# Patient Record
Sex: Female | Born: 1985 | Race: White | Hispanic: No | Marital: Married | Smoking: Never smoker
Health system: Southern US, Community
[De-identification: ages and names within clinical notes are randomized; demographics above are authoritative.]

## PROBLEM LIST (undated history)

## (undated) ENCOUNTER — Inpatient Hospital Stay (HOSPITAL_COMMUNITY): Payer: Self-pay

## (undated) DIAGNOSIS — G43909 Migraine, unspecified, not intractable, without status migrainosus: Secondary | ICD-10-CM

## (undated) DIAGNOSIS — N979 Female infertility, unspecified: Secondary | ICD-10-CM

## (undated) HISTORY — PX: OTHER SURGICAL HISTORY: SHX169

---
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
## 2008-03-21 ENCOUNTER — Other Ambulatory Visit: Admission: RE | Admit: 2008-03-21 | Discharge: 2008-03-21 | Payer: Self-pay | Admitting: Obstetrics and Gynecology

## 2009-03-15 ENCOUNTER — Emergency Department (HOSPITAL_COMMUNITY): Admission: EM | Admit: 2009-03-15 | Discharge: 2009-03-15 | Payer: Self-pay | Admitting: Emergency Medicine

## 2009-04-11 ENCOUNTER — Other Ambulatory Visit: Admission: RE | Admit: 2009-04-11 | Discharge: 2009-04-11 | Payer: Self-pay | Admitting: Obstetrics and Gynecology

## 2010-05-14 LAB — CBC
Hemoglobin: 14.1 g/dL (ref 12.0–15.0)
RBC: 4.82 MIL/uL (ref 3.87–5.11)
WBC: 8.3 10*3/uL (ref 4.0–10.5)

## 2010-05-14 LAB — DIFFERENTIAL
Lymphocytes Relative: 37 % (ref 12–46)
Lymphs Abs: 3 10*3/uL (ref 0.7–4.0)
Monocytes Absolute: 0.5 10*3/uL (ref 0.1–1.0)
Monocytes Relative: 6 % (ref 3–12)
Neutro Abs: 4.5 10*3/uL (ref 1.7–7.7)

## 2010-05-14 LAB — BASIC METABOLIC PANEL
CO2: 26 mEq/L (ref 19–32)
Calcium: 9.4 mg/dL (ref 8.4–10.5)
GFR calc Af Amer: >60 mL/min (ref >60)
GFR calc non Af Amer: >60 mL/min (ref >60)
Sodium: 136 mEq/L (ref 135–145)

## 2010-05-14 LAB — URINALYSIS, ROUTINE W REFLEX MICROSCOPIC
Glucose, UA: NEGATIVE mg/dL
Ketones, ur: NEGATIVE mg/dL
Leukocytes, UA: NEGATIVE
Specific Gravity, Urine: 1.03 (ref 1.005–1.030)
pH: 5 (ref 5.0–8.0)

## 2010-05-14 LAB — URINE MICROSCOPIC-ADD ON

## 2010-05-22 ENCOUNTER — Other Ambulatory Visit (HOSPITAL_COMMUNITY)
Admission: RE | Admit: 2010-05-22 | Discharge: 2010-05-22 | Disposition: A | Discharge status: Home / Self Care | Payer: No Typology Code available for payment source | Source: Ambulatory Visit | Attending: Obstetrics and Gynecology | Admitting: Obstetrics and Gynecology

## 2010-05-22 DIAGNOSIS — Z01419 Encounter for gynecological examination (general) (routine) without abnormal findings: Secondary | ICD-10-CM | POA: Insufficient documentation

## 2010-05-22 DIAGNOSIS — Z113 Encounter for screening for infections with a predominantly sexual mode of transmission: Secondary | ICD-10-CM | POA: Insufficient documentation

## 2011-04-01 ENCOUNTER — Other Ambulatory Visit: Payer: Self-pay | Admitting: Obstetrics and Gynecology

## 2011-04-01 DIAGNOSIS — N979 Female infertility, unspecified: Secondary | ICD-10-CM

## 2011-04-02 ENCOUNTER — Ambulatory Visit (HOSPITAL_COMMUNITY)
Admission: RE | Admit: 2011-04-02 | Discharge: 2011-04-02 | Disposition: A | Discharge status: Home / Self Care | Payer: BC Managed Care – PPO | Source: Ambulatory Visit | Attending: Obstetrics and Gynecology | Admitting: Obstetrics and Gynecology

## 2011-04-02 DIAGNOSIS — N979 Female infertility, unspecified: Secondary | ICD-10-CM | POA: Insufficient documentation

## 2011-04-02 MED ORDER — IOHEXOL 300 MG/ML  SOLN
35.0000 mL | Freq: Once | INTRAMUSCULAR | Status: AC | PRN
  Administered 2011-04-02: 35 mL

## 2011-08-13 ENCOUNTER — Encounter (HOSPITAL_BASED_OUTPATIENT_CLINIC_OR_DEPARTMENT_OTHER): Admission: RE | Payer: Self-pay | Source: Ambulatory Visit

## 2011-08-13 ENCOUNTER — Ambulatory Visit (HOSPITAL_BASED_OUTPATIENT_CLINIC_OR_DEPARTMENT_OTHER): Admission: RE | Admit: 2011-08-13 | Payer: BC Managed Care – PPO | Source: Ambulatory Visit | Admitting: Otolaryngology

## 2011-08-13 SURGERY — TONSILLECTOMY AND ADENOIDECTOMY
Anesthesia: General

## 2013-04-16 ENCOUNTER — Other Ambulatory Visit (HOSPITAL_COMMUNITY): Payer: Self-pay | Admitting: Internal Medicine

## 2013-04-16 DIAGNOSIS — H539 Unspecified visual disturbance: Secondary | ICD-10-CM

## 2013-04-16 DIAGNOSIS — R51 Headache: Principal | ICD-10-CM

## 2013-04-16 DIAGNOSIS — R519 Headache, unspecified: Secondary | ICD-10-CM

## 2013-04-19 ENCOUNTER — Ambulatory Visit (HOSPITAL_COMMUNITY)
Admission: RE | Admit: 2013-04-19 | Discharge: 2013-04-19 | Disposition: A | Discharge status: Home / Self Care | Payer: BC Managed Care – PPO | Source: Ambulatory Visit | Attending: Internal Medicine | Admitting: Internal Medicine

## 2013-04-19 DIAGNOSIS — R51 Headache: Secondary | ICD-10-CM | POA: Insufficient documentation

## 2013-04-19 DIAGNOSIS — R519 Headache, unspecified: Secondary | ICD-10-CM

## 2013-04-19 DIAGNOSIS — H539 Unspecified visual disturbance: Secondary | ICD-10-CM

## 2013-04-19 DIAGNOSIS — R5383 Other fatigue: Secondary | ICD-10-CM

## 2013-04-19 DIAGNOSIS — R5381 Other malaise: Secondary | ICD-10-CM | POA: Insufficient documentation

## 2013-04-19 DIAGNOSIS — F40298 Other specified phobia: Secondary | ICD-10-CM | POA: Insufficient documentation

## 2013-04-19 MED ORDER — GADOBENATE DIMEGLUMINE 529 MG/ML IV SOLN
15.0000 mL | Freq: Once | INTRAVENOUS | Status: AC | PRN
  Administered 2013-04-19: 15 mL via INTRAVENOUS

## 2014-01-17 LAB — OB RESULTS CONSOLE ABO/RH: RH Type: POSITIVE

## 2014-01-17 LAB — OB RESULTS CONSOLE HEPATITIS B SURFACE ANTIGEN: Hepatitis B Surface Ag: NEGATIVE

## 2014-01-17 LAB — OB RESULTS CONSOLE ANTIBODY SCREEN: Antibody Screen: NEGATIVE

## 2014-01-17 LAB — OB RESULTS CONSOLE RUBELLA ANTIBODY, IGM: RUBELLA: IMMUNE

## 2014-01-17 LAB — OB RESULTS CONSOLE HIV ANTIBODY (ROUTINE TESTING): HIV: NONREACTIVE

## 2014-01-17 LAB — OB RESULTS CONSOLE RPR: RPR: NONREACTIVE

## 2014-02-14 ENCOUNTER — Inpatient Hospital Stay (HOSPITAL_COMMUNITY)
Admission: AD | Admit: 2014-02-14 | Payer: BC Managed Care – PPO | Source: Ambulatory Visit | Admitting: Obstetrics and Gynecology

## 2014-02-25 NOTE — L&D Delivery Note (Signed)
Delivery Note At 6:22 AM a viable female "Wendy Bowers" was delivered via Vaginal, Spontaneous Delivery (Presentation: OA restituting to Left Occiput Anterior).  APGARS: 9, 9; weight pending.   Placenta status: Intact, Spontaneous.  Cord: 3 vessels with the following complications: CAN x 2, easily reduced.  Cord pH: NA  Anesthesia: Epidural  Episiotomy: None Lacerations: 2nd degree;Perineal Suture Repair: 3.0 vicryl CT-1 and SH Est. Blood Loss (mL): 606  Mom to postpartum.  Baby to Couplet care / Skin to Skin.  Pt undecided re: birth control.  Plans to breastfeed.   [name], Ginette Bradway 07/30/2014, 7:45 AM

## 2014-04-14 ENCOUNTER — Emergency Department (HOSPITAL_COMMUNITY): Payer: BLUE CROSS/BLUE SHIELD

## 2014-04-14 ENCOUNTER — Emergency Department (HOSPITAL_COMMUNITY)
Admission: EM | Admit: 2014-04-14 | Discharge: 2014-04-14 | Disposition: A | Discharge status: Home / Self Care | Payer: BLUE CROSS/BLUE SHIELD | Attending: Emergency Medicine | Admitting: Emergency Medicine

## 2014-04-14 ENCOUNTER — Encounter (HOSPITAL_COMMUNITY): Payer: Self-pay | Admitting: Emergency Medicine

## 2014-04-14 DIAGNOSIS — Z3A23 23 weeks gestation of pregnancy: Secondary | ICD-10-CM | POA: Insufficient documentation

## 2014-04-14 DIAGNOSIS — M542 Cervicalgia: Secondary | ICD-10-CM | POA: Insufficient documentation

## 2014-04-14 DIAGNOSIS — O9989 Other specified diseases and conditions complicating pregnancy, childbirth and the puerperium: Secondary | ICD-10-CM | POA: Diagnosis not present

## 2014-04-14 DIAGNOSIS — Z3490 Encounter for supervision of normal pregnancy, unspecified, unspecified trimester: Secondary | ICD-10-CM

## 2014-04-14 MED ORDER — HYDROCODONE-ACETAMINOPHEN 5-325 MG PO TABS: 1.0000 | ORAL_TABLET | Freq: Four times a day (QID) | ORAL | Status: DC | PRN

## 2014-04-14 MED ORDER — CYCLOBENZAPRINE HCL 5 MG PO TABS: 5.0000 mg | ORAL_TABLET | Freq: Three times a day (TID) | ORAL | Status: DC | PRN

## 2014-04-14 MED ORDER — FENTANYL CITRATE 0.05 MG/ML IJ SOLN
50.0000 ug | Freq: Once | INTRAMUSCULAR | Status: AC
  Administered 2014-04-14: 50 ug via INTRAMUSCULAR
  Filled 2014-04-14: qty 2

## 2014-04-14 MED ORDER — CYCLOBENZAPRINE HCL 10 MG PO TABS
10.0000 mg | ORAL_TABLET | Freq: Once | ORAL | Status: AC
  Administered 2014-04-14: 10 mg via ORAL
  Filled 2014-04-14: qty 1

## 2014-04-14 NOTE — ED Notes (Signed)
Discharge instructions given, pt demonstrated teach back and verbal understanding. No concerns voiced.  

## 2014-04-14 NOTE — ED Notes (Signed)
Pt c/o neck pain that started tonight and denies any injury.

## 2014-04-14 NOTE — ED Provider Notes (Signed)
CSN: [id number]     Arrival date & time 04/14/14  0210 History   First MD Initiated Contact with Patient 04/14/14 0246     Chief Complaint  Patient presents with  . Neck Pain     (Consider location/radiation/quality/duration/timing/severity/associated sxs/prior Treatment) HPI  Patient states she is G1 P0 AB 0, [redacted] weeks pregnant. Her due date is June 13. She reports she had her usual monthly massage on 2/16. She states at 7 PM on February 17 she started getting pain in her upper neck at the base of her skull. She states it's mainly on the right side and constant. It hurts more when any type of movement of her head. She denies any change in her activity or injury. She states she's never had this before. She states she has tingling of her toes bilaterally. She denies any weakness or difficulty walking. She denies any fever. She's had a mild cough but otherwise does not have any significant URI symptoms. She states she did put a heating pad on it and had taken a hot shower which seemed to help. About 9 PM she states she took 800 mg of acetaminophen without relief.   [facility]  History reviewed. No pertinent past medical history. History reviewed. No pertinent past surgical history. History reviewed. No pertinent family history. History  Substance Use Topics  . Smoking status: Never Smoker   . Smokeless tobacco: Not on file  . 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     Allergies  Review of patient's allergies indicates not on file.  Home Medications   Prior to Admission medications   Medication Sig Start Date End Date Taking? Authorizing Provider  Prenatal Vit-Fe Fumarate-FA (MULTIVITAMIN-PRENATAL) 27-0.8 MG TABS tablet Take 1 tablet by mouth  daily at 12 noon.   Yes Historical Provider, MD  cyclobenzaprine (FLEXERIL) 5 MG tablet Take 1 tablet (5 mg total) by mouth 3 (three) times daily as needed for muscle spasms. 04/14/14   Ward GivensIva L Akina Maish, MD  HYDROcodone-acetaminophen (NORCO) 5-325 MG per tablet Take 1 tablet by mouth every 6 (six) hours as needed for moderate pain. 04/14/14   Ward GivensIva L Demetrius Barrell, MD   BP 130/84 mmHg  Pulse 96  Temp(Src) 97.9 F (36.6 C) (Oral)  Resp 17  Ht 5\' 6"  (1.676 m)  Wt 228 lb (103.42 kg)  BMI 36.82 kg/m2  SpO2 98%  LMP 11/04/2013  Vital signs normal   Physical Exam  Constitutional: She is oriented to person, place, and time. She appears well-developed and well-nourished.  Non-toxic appearance. She does not appear ill. No distress.  HENT:  Head: Normocephalic and atraumatic.  Right Ear: External ear normal.  Left Ear: External ear normal.  Nose: Nose normal. No mucosal edema or rhinorrhea.  Mouth/Throat: Oropharynx is clear and moist and mucous membranes are normal. No dental abscesses or uvula swelling.  Eyes: Conjunctivae and EOM are normal. Pupils are equal, round, and reactive to light.  Neck: Normal range of motion and full passive range of motion without pain. Neck supple.  Cardiovascular: Normal rate, regular rhythm and normal heart sounds.  Exam reveals no gallop and no friction rub.   No  murmur heard. Pulmonary/Chest: Effort normal and breath sounds normal. No respiratory distress. She has no wheezes. She has no rhonchi. She has no rales. She exhibits no tenderness and no crepitus.  Abdominal: Soft. Normal appearance and bowel sounds are normal. She exhibits no distension. There is no tenderness. There is no rebound and no guarding.  Musculoskeletal: Normal range of motion. She exhibits no edema or tenderness.  Moves all extremities well.   Neurological: She is alert and oriented to person, place, and time. She has normal strength. No cranial nerve deficit.  No cranial nerve deficit, grips are equal  bilaterally. She has normal upper extremity strength at the level of the shoulders and elbows. She has no weakness of her lower extremities. She has subjective decreased sensation to palpation diffusely in her right lower leg.  Skin: Skin is warm, dry and intact. No rash noted. No erythema. No pallor.  Psychiatric: She has a normal mood and affect. Her speech is normal and behavior is normal. Her mood appears not anxious.  Nursing note and vitals reviewed.   ED Course  Procedures (including critical care time)  Medications  fentaNYL (SUBLIMAZE) injection 50 mcg (50 mcg Intramuscular Given 04/14/14 0325)  cyclobenzaprine (FLEXERIL) tablet 10 mg (10 mg Oral Given 04/14/14 0435)   Fetal heart rate 161  Recheck after the fentanyl. Patient states her pain is improved however she gets intermittent spasms in her neck.  04:24 Dr Su Hilt, OB, states flexeril would be a muscle relaxer she could take and ibuprofen until 28 weeks.   Labs Review Labs Reviewed - No data to display  Imaging Review Dg Cervical Spine Complete  04/14/2014   CLINICAL DATA:  Posterior neck pain yesterday.  No trauma.  EXAM: CERVICAL SPINE  4+ VIEWS  COMPARISON:  None.  FINDINGS: The cervical vertebrae are normal in height. There is a right cervical rib. No fracture is evident. No arthritic changes are evident. There is marked kyphosis centered at C5-6 and there is a mild right convex curvature centered at T1. No bone lesion or bony destruction is evident.  IMPRESSION: Curvature and kyphosis.  Right cervical rib.   Electronically Signed   By: Ellery Plunk M.D.   On: 04/14/2014 03:40     EKG Interpretation None      MDM  I feel patient's abnormal curvature of her cervical spine is because of the way she's holding her head because of pain. When she's sitting at the bedside she has a unnatural appearance to the way she holds her head. I do not feel the tingling in her extremities are sign of significant upper cervical  problem.    Final diagnoses:  Neck pain  Pregnancy    New Prescriptions   CYCLOBENZAPRINE (FLEXERIL) 5 MG TABLET    Take 1 tablet (5 mg total) by mouth 3 (three) times daily as needed for muscle spasms.   HYDROCODONE-ACETAMINOPHEN (NORCO) 5-325 MG PER TABLET    Take 1 tablet by mouth every 6 (six) hours as needed for moderate pain.  OTC ibuprofen  Plan discharge   Devoria Albe, MD, Franz Dell, MD 04/14/14 [phone]

## 2014-04-14 NOTE — Discharge Instructions (Signed)
Use ice and heat on the sore muscles in your neck. Take the medication as prescribed. You can also take ibuprofen 600 mg 4 times a day for pain (do not take ibuprofen after 28 weeks of pregnancy). Recheck if you get worse instead of better (fever, weakness in your legs, difficulty using your hands).

## 2014-05-08 ENCOUNTER — Inpatient Hospital Stay (HOSPITAL_COMMUNITY)
Admission: AD | Admit: 2014-05-08 | Payer: BLUE CROSS/BLUE SHIELD | Source: Ambulatory Visit | Admitting: Obstetrics & Gynecology

## 2014-07-29 ENCOUNTER — Encounter (HOSPITAL_COMMUNITY): Payer: Self-pay | Admitting: *Deleted

## 2014-07-29 ENCOUNTER — Inpatient Hospital Stay (HOSPITAL_COMMUNITY)
Admission: AD | Admit: 2014-07-29 | Discharge: 2014-08-01 | DRG: 774 | Disposition: A | Discharge status: Home / Self Care | Payer: BLUE CROSS/BLUE SHIELD | Source: Ambulatory Visit | Attending: Obstetrics and Gynecology | Admitting: Obstetrics and Gynecology

## 2014-07-29 ENCOUNTER — Inpatient Hospital Stay (HOSPITAL_COMMUNITY): Payer: BLUE CROSS/BLUE SHIELD

## 2014-07-29 DIAGNOSIS — D72829 Elevated white blood cell count, unspecified: Secondary | ICD-10-CM | POA: Diagnosis not present

## 2014-07-29 DIAGNOSIS — O4292 Full-term premature rupture of membranes, unspecified as to length of time between rupture and onset of labor: Principal | ICD-10-CM | POA: Diagnosis present

## 2014-07-29 DIAGNOSIS — E559 Vitamin D deficiency, unspecified: Secondary | ICD-10-CM

## 2014-07-29 DIAGNOSIS — Z3A38 38 weeks gestation of pregnancy: Secondary | ICD-10-CM | POA: Diagnosis present

## 2014-07-29 DIAGNOSIS — O99284 Endocrine, nutritional and metabolic diseases complicating childbirth: Secondary | ICD-10-CM | POA: Diagnosis present

## 2014-07-29 DIAGNOSIS — O99214 Obesity complicating childbirth: Secondary | ICD-10-CM | POA: Diagnosis present

## 2014-07-29 DIAGNOSIS — E669 Obesity, unspecified: Secondary | ICD-10-CM | POA: Diagnosis present

## 2014-07-29 DIAGNOSIS — O9913 Other diseases of the blood and blood-forming organs and certain disorders involving the immune mechanism complicating the puerperium: Secondary | ICD-10-CM | POA: Diagnosis not present

## 2014-07-29 DIAGNOSIS — O1493 Unspecified pre-eclampsia, third trimester: Secondary | ICD-10-CM | POA: Diagnosis present

## 2014-07-29 DIAGNOSIS — O99354 Diseases of the nervous system complicating childbirth: Secondary | ICD-10-CM | POA: Diagnosis present

## 2014-07-29 DIAGNOSIS — G43909 Migraine, unspecified, not intractable, without status migrainosus: Secondary | ICD-10-CM

## 2014-07-29 DIAGNOSIS — N979 Female infertility, unspecified: Secondary | ICD-10-CM

## 2014-07-29 DIAGNOSIS — Z6839 Body mass index (BMI) 39.0-39.9, adult: Secondary | ICD-10-CM

## 2014-07-29 DIAGNOSIS — O329XX0 Maternal care for malpresentation of fetus, unspecified, not applicable or unspecified: Secondary | ICD-10-CM

## 2014-07-29 DIAGNOSIS — O1490 Unspecified pre-eclampsia, unspecified trimester: Secondary | ICD-10-CM | POA: Diagnosis present

## 2014-07-29 DIAGNOSIS — Z8279 Family history of other congenital malformations, deformations and chromosomal abnormalities: Secondary | ICD-10-CM

## 2014-07-29 DIAGNOSIS — O329XX1 Maternal care for malpresentation of fetus, unspecified, fetus 1: Secondary | ICD-10-CM

## 2014-07-29 DIAGNOSIS — O09813 Supervision of pregnancy resulting from assisted reproductive technology, third trimester: Secondary | ICD-10-CM | POA: Diagnosis not present

## 2014-07-29 HISTORY — DX: Migraine, unspecified, not intractable, without status migrainosus: G43.909

## 2014-07-29 HISTORY — DX: Female infertility, unspecified: N97.9

## 2014-07-29 LAB — CBC WITH DIFFERENTIAL/PLATELET
BASOS PCT: 0 % (ref 0–1)
Basophils Absolute: 0 10*3/uL (ref 0.0–0.1)
EOS PCT: 1 % (ref 0–5)
Eosinophils Absolute: 0.2 10*3/uL (ref 0.0–0.7)
HCT: 34.3 % — ABNORMAL LOW (ref 36.0–46.0)
Hemoglobin: 11.3 g/dL — ABNORMAL LOW (ref 12.0–15.0)
LYMPHS PCT: 26 % (ref 12–46)
Lymphs Abs: 3.5 10*3/uL (ref 0.7–4.0)
MCH: 26.7 pg (ref 26.0–34.0)
MCHC: 32.9 g/dL (ref 30.0–36.0)
MCV: 80.9 fL (ref 78.0–100.0)
MONO ABS: 1 10*3/uL (ref 0.1–1.0)
MONOS PCT: 8 % (ref 3–12)
NEUTROS ABS: 8.9 10*3/uL — AB (ref 1.7–7.7)
NEUTROS PCT: 65 % (ref 43–77)
Platelets: 303 10*3/uL (ref 150–400)
RBC: 4.24 MIL/uL (ref 3.87–5.11)
RDW: 14 % (ref 11.5–15.5)
WBC: 13.7 10*3/uL — ABNORMAL HIGH (ref 4.0–10.5)

## 2014-07-29 LAB — TYPE AND SCREEN
ABO/RH(D): O POS
Antibody Screen: NEGATIVE

## 2014-07-29 LAB — COMPREHENSIVE METABOLIC PANEL
ALBUMIN: 2.6 g/dL — AB (ref 3.5–5.0)
ALT: 14 U/L (ref 14–54)
ANION GAP: 5 (ref 5–15)
AST: 17 U/L (ref 15–41)
Alkaline Phosphatase: 111 U/L (ref 38–126)
BILIRUBIN TOTAL: 0.2 mg/dL — AB (ref 0.3–1.2)
BUN: 14 mg/dL (ref 6–20)
CALCIUM: 9.2 mg/dL (ref 8.9–10.3)
CO2: 24 mmol/L (ref 22–32)
Chloride: 103 mmol/L (ref 101–111)
Creatinine, Ser: 0.61 mg/dL (ref 0.44–1.00)
GLUCOSE: 89 mg/dL (ref 65–99)
POTASSIUM: 4.7 mmol/L (ref 3.5–5.1)
Sodium: 132 mmol/L — ABNORMAL LOW (ref 135–145)
Total Protein: 6.4 g/dL — ABNORMAL LOW (ref 6.5–8.1)

## 2014-07-29 LAB — ABO/RH: ABO/RH(D): O POS

## 2014-07-29 LAB — POCT FERN TEST: POCT Fern Test: POSITIVE

## 2014-07-29 LAB — URIC ACID: Uric Acid, Serum: 4.8 mg/dL (ref 2.3–6.6)

## 2014-07-29 LAB — PROTEIN / CREATININE RATIO, URINE
CREATININE, URINE: 95 mg/dL
PROTEIN CREATININE RATIO: 0.19 mg/mg{creat} — AB (ref 0.00–0.15)
TOTAL PROTEIN, URINE: 18 mg/dL

## 2014-07-29 LAB — LACTATE DEHYDROGENASE: LDH: 136 U/L (ref 98–192)

## 2014-07-29 LAB — RPR: RPR Ser Ql: NONREACTIVE

## 2014-07-29 MED ORDER — OXYCODONE-ACETAMINOPHEN 5-325 MG PO TABS: 2.0000 | ORAL_TABLET | ORAL | Status: DC | PRN

## 2014-07-29 MED ORDER — LACTATED RINGERS IV SOLN: 500.0000 mL | INTRAVENOUS | Status: DC | PRN

## 2014-07-29 MED ORDER — NALBUPHINE HCL 10 MG/ML IJ SOLN
10.0000 mg | INTRAMUSCULAR | Status: DC | PRN
  Administered 2014-07-29: 10 mg via INTRAVENOUS
  Filled 2014-07-29: qty 1

## 2014-07-29 MED ORDER — OXYTOCIN BOLUS FROM INFUSION
500.0000 mL | INTRAVENOUS | Status: DC
  Administered 2014-07-30: 500 mL via INTRAVENOUS

## 2014-07-29 MED ORDER — OXYTOCIN 40 UNITS IN LACTATED RINGERS INFUSION - SIMPLE MED
62.5000 mL/h | INTRAVENOUS | Status: DC
  Administered 2014-07-30: 62.5 mL/h via INTRAVENOUS
  Filled 2014-07-29: qty 1000

## 2014-07-29 MED ORDER — ACETAMINOPHEN 325 MG PO TABS
650.0000 mg | ORAL_TABLET | ORAL | Status: DC | PRN
  Administered 2014-07-29 (×2): 650 mg via ORAL
  Filled 2014-07-29 (×2): qty 2

## 2014-07-29 MED ORDER — LIDOCAINE HCL (PF) 1 % IJ SOLN
30.0000 mL | INTRAMUSCULAR | Status: DC | PRN
  Filled 2014-07-29: qty 30

## 2014-07-29 MED ORDER — OXYCODONE-ACETAMINOPHEN 5-325 MG PO TABS: 1.0000 | ORAL_TABLET | ORAL | Status: DC | PRN

## 2014-07-29 MED ORDER — HYDROXYZINE HCL 50 MG PO TABS
50.0000 mg | ORAL_TABLET | Freq: Four times a day (QID) | ORAL | Status: DC | PRN
  Filled 2014-07-29: qty 1

## 2014-07-29 MED ORDER — TERBUTALINE SULFATE 1 MG/ML IJ SOLN
0.2500 mg | Freq: Once | INTRAMUSCULAR | Status: AC | PRN
Start: 2014-07-29 — End: 2014-07-29

## 2014-07-29 MED ORDER — ONDANSETRON HCL 4 MG/2ML IJ SOLN: 4.0000 mg | Freq: Four times a day (QID) | INTRAMUSCULAR | Status: DC | PRN

## 2014-07-29 MED ORDER — CITRIC ACID-SODIUM CITRATE 334-500 MG/5ML PO SOLN
30.0000 mL | ORAL | Status: DC | PRN
Start: 2014-07-29 — End: 2014-07-30

## 2014-07-29 MED ORDER — MISOPROSTOL 200 MCG PO TABS
50.0000 ug | ORAL_TABLET | ORAL | Status: DC
  Administered 2014-07-29 (×2): 50 ug via ORAL
  Filled 2014-07-29 (×2): qty 0.5

## 2014-07-29 MED ORDER — FLEET ENEMA 7-19 GM/118ML RE ENEM: 1.0000 | ENEMA | RECTAL | Status: DC | PRN

## 2014-07-29 MED ORDER — LACTATED RINGERS IV SOLN
INTRAVENOUS | Status: DC
  Administered 2014-07-30: via INTRAVENOUS

## 2014-07-29 NOTE — Progress Notes (Signed)
  Subjective: Assuming care of this 29 yo G1P0 at 38.4 wks admitted for SROM. Initial evaluation revealed preeclampsia w/o severe features. On assessment, BP improved, h/a and blurred vision resolved. Denies RUQ pain, CP, SOB, N/V, weakness or bleeding.  Doula = Viviana SimplerKenny Shulman Completed WB class --- has proof of attendance  Objective: BP 139/76 mmHg  Pulse 109  Temp(Src) 97.7 F (36.5 C) (Oral)  Resp 18  Ht 5\' 6"  (1.676 m)  Wt 112.038 kg (247 lb)  BMI 39.89 kg/m2  LMP 11/04/2013      FHT: Category 1 Lungs: CTAB CV: RRR w/o M/R/G Abdomen: gravid, soft, NTND, no guarding or rebound tenderness Pelvic: Deferred Ext: 3+ DTRs, no clonus, 3+ pitting edema UC:   irregular, every 2-3 minutes SVE:   Dilation: 1.5 Effacement (%): 70 Station: -1 Exam by:: [email or web address] Last po Cytotec at 1500  Assessment:  IUP at term. Preeclampsia w/o severe features. Cat 1 FHRT. No indication for Magnesium Sulfate or IV Labetalol at this time.   Plan: At this point, pt is not a candidate for WB as she is on po Cytotec w/ the potential for Pitocin, with preeclampsia w/o severe features. Collaborated w/ Dr. Sallye OberKulwa who agrees.  Birth plan/wishes reviewed w/ pt and spouse. Continue induction.  [name], Suzane Vanderweide CNM 07/29/2014, 4:54 PM   ADDENDUM: Per RN PE earlier showed increased edema and 1 beat clonus. Occasional increase in BPs 150s/80-90.  Sherre ScarletKimberly Adelae Yodice, CNM 07/29/14, 5:24 PM  ADDENDUM: Consulted Dr. Estanislado Pandyivard at 19:00 re: exam findings. It was again decided that pt was not a candidate for WB. Couple informed and verbalized understanding of non candidacy. She therefore requested IV pain med, then ultimately an epidural. Progressed rapidly from 3-4 cm to 6-7 cm to complete within 2 hrs. No urge to push. Allowed passive descent. Trial push initiated w/ good result. Pt and room then prepped for delivery.  07/30/14, 07:10 AM

## 2014-07-29 NOTE — MAU Note (Signed)
Pt presents to MAU with complaints of leakage of fluid since around 930 tonight. Denies any contractions. Reports good fetal movement.

## 2014-07-29 NOTE — Progress Notes (Signed)
Sherre ScarletKimberly Williams CNM notified of pt's complaints and blood pressure, orders received

## 2014-07-29 NOTE — Progress Notes (Signed)
Midwife will dicuss waterbirth protocols with pt tonight.

## 2014-07-29 NOTE — Progress Notes (Addendum)
Labor Progress  Subjective: Pt had declined augmentation or intervention pending the arrival of her husband from out of town at noon today.  Options discussed including Cytotec for cervical ripering..  Pt was open to that.  She has request to have breakfast and take a shower first.  Pt desires a waterbirth  Objective: BP 117/62 mmHg  Pulse 93  Temp(Src) 97.9 F (36.6 C) (Oral)  Resp 18  Ht 5\' 6"  (1.676 m)  Wt 247 lb (112.038 kg)  BMI 39.89 kg/m2  LMP 11/04/2013     FHT: 135 moderate variability, + accel, no decels CTX:  None, irritibility Uterus gravid, soft non tender SVE:  Dilation: Fingertip Effacement (%): 70 Exam by:: DCALLAWAY, RN   Assessment:  IUP at 38.1 weeks NICHD: Category 1 Membranes: intact Labor progress: Inadquate labor GBS: negative   Plan: Continue labor plan intermittent monitoring ok Ambulate Frequent position changes to facilitate fetal rotation and descent. Will reassess with cervical exam at 0900 or earlier if necessary Bedside US to confirm position cytotec 50mcg PO q 4 per protocol     Deandra Goering, CNM, MSN 07/29/2014. 7:49 AM  I saw patient at bedside and discussed above plan. First Cytotec was given orally at 10 AM.  EFW at 34 weeks by ultrasound: 5lbs. Dr. Sallye OberKulwa.

## 2014-07-29 NOTE — Progress Notes (Signed)
CNM clears pt to go walk at this time

## 2014-07-29 NOTE — Plan of Care (Signed)
Problem: Consults Goal: Birthing Suites Patient Information Press F2 to bring up selections list Outcome: Completed/Met Date Met:  07/29/14  Pt 37-[redacted] weeks EGA and PIH (Pregnancy induced hypertension)

## 2014-07-29 NOTE — Progress Notes (Signed)
Beside US unable to determine vertex.  limited US ordered forplacement

## 2014-07-29 NOTE — H&P (Signed)
Wendy FischerSydney L Bowers is a 29 y.o. female, G1P0 at 38.4 weeks, presenting w/ LOF, clear fluid since 10:30 PM on 07/28/14. Reports cramping. +FM. Denies bleeding, but states lost mucus plug which was pink tinged. Of note, reports new onset of generalized edema and blurred vision. Denies headache, scotomata, RUQ pain, SOB, CP, weakness or N/V.  [facility] but [place]   Patient Active Problem List   Diagnosis Date Noted  . Hypertension in pregnancy, preeclampsia 07/29/2014  . Vitamin D deficiency 07/29/2014  . Migraine headache 07/29/2014  . Infertility, female, primary - IVF pregnancy 07/29/2014  . Family history of cleft palate with cleft lip 07/29/2014    History of present pregnancy: Patient entered care at 14.2 weeks, transfer in from [facility].   EDC of 08/08/14 was established by sure LMP and that was consistent with u/s at 12.4 wks. Pregnancy achieved through IVF.   Anatomy scan: 19.1 weeks, with normal findings and a fundal placenta. Left ovary w/ hyperstimulated appearance.   Additional US evaluations:  34 wks due to IVF (growth): SIUP, vertex, normal fluid, growth 40%tile -- 5 lbs (2298 g).   Significant prenatal events: Transfer in from [facility]. [facility]). Vomiting in early pregnancy. Headaches x 2 wks at 23 wks - relieved w/ liquid Tylenol. Seen in ER at 28 wks due to neck pain after monthly massage - no significant findings - pain meds Rx'd. C/O dizziness at 28 wks - management for dizziness discussed w/ pt. Pelvic pressure between 28-30 wks after riding horse - was instructed to refrain from riding horse until pp due to fall risk. Vomiting around 36 wks in the evening and BH ctxs.TWG 24 lbs while under the care of [facility]. Pill aversion - only takes liquid Tyl when gets headache.  Last evaluation: Office 07/26/14 @ 38.1 wks by Everardo PacificSDevane-Johnson, CNM. Cvx closed/50%/-2. BP  120/78.  OB History    Gravida Para Term Preterm AB TAB SAB Ectopic Multiple Living   1              History reviewed. No pertinent past medical history. Past Surgical History  Procedure Laterality Date  . Ivf    . Wisdom tooth extraction Bilateral 1999  Bunion Surgery at age 29  Family History: family history is not on file.Sister w/ cleft lip/palate Social History:  reports that she has never smoked. She has never used smokeless tobacco. She reports that she does not drink alcohol or use illicit drugs.Pt is a Caucasian female who is married to Harbor IsleKeith. 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 She has no religious preference and will accept blood in an emergency.   Prenatal Transfer Tool  Maternal Diabetes: No Genetic Screening: Normal first trimester screen. Declined AFP. Maternal Ultrasounds/Referrals: Normal Fetal Ultrasounds or other Referrals:  None Maternal Substance Abuse:  No Significant Maternal Medications:  None Significant Maternal Lab Results: Lab values include: Group B Strep negative  TDAP: 05/17/14 Flu: 01/17/14  ROS: As noted in Hx  No Known Allergies   Dilation: Fingertip Effacement (%): 70 Exam by:: DCALLAWAY, RN Blood pressure 138/76, pulse 92, temperature 97.7 F (36.5 C), temperature source Oral, resp. rate 16, height 5\' 6"  (1.676 m), weight 112.038 kg (247 lb), last menstrual period 11/04/2013. Today's Vitals   07/29/14 0202 07/29/14 0231 07/29/14 0301 07/29/14 0331  BP: 136/76 131/85 126/83 138/76  Pulse: 93 95  92 92  Temp:    97.7 F (36.5 C)  TempSrc:    Oral  Resp:    16  Height:      Weight:      PainSc:       Chest clear Heart RRR without murmur Abd gravid, obese, NT, FH CWD Pelvic: As above. +ferning Ext: 3+ edema (pitting), no clonus  FHR: Baseline 140 w/ moderate variability, +accels, no decels UCs: Irregular, q 5 minutes  Prenatal labs: ABO, Rh: --/--/O POS (06/03  0344) Antibody: NEG (06/03 0344) Rubella:   Immune (01/17/14) RPR: Nonreactive (11/23 0000)  HBsAg: Negative (11/23 0000)  HIV: Non-reactive (11/23 0000)  GBS:  Neg (07/11/14) Sickle cell/Hgb electrophoresis: NA Pap: Normal in Nov 2015 at [facility] [place] GC: No record Chlamydia: No record Genetic screenings: Neg 1st trimester screen and neg CF screen (in past). Glucola: Normal at 113 Other: E. Coli on NOB urine culture; TOC neg. Vitamin D deficiency.  Hgb 13.1 at NOB, 12.2 at 28 weeks Results for orders placed or performed during the hospital encounter of 07/29/14 (from the past 24 hour(s))  Protein / creatinine ratio, urine     Status: Abnormal   Collection Time: 07/29/14 12:50 AM  Result Value Ref Range   Creatinine, Urine 95.00 mg/dL   Total Protein, Urine 18 mg/dL   Protein Creatinine Ratio 0.19 (H) 0.00 - 0.15 mg/mg[Cre]  Fern Test     Status: Normal   Collection Time: 07/29/14  1:09 AM  Result Value Ref Range   POCT Fern Test Positive = ruptured amniotic membanes   Uric acid     Status: None   Collection Time: 07/29/14  1:10 AM  Result Value Ref Range   Uric Acid, Serum 4.8 2.3 - 6.6 mg/dL  CBC with Differential     Status: Abnormal   Collection Time: 07/29/14  1:10 AM  Result Value Ref Range   WBC 13.7 (H) 4.0 - 10.5 K/uL   RBC 4.24 3.87 - 5.11 MIL/uL   Hemoglobin 11.3 (L) 12.0 - 15.0 g/dL   HCT 16.1 (L) 09.6 - 04.5 %   MCV 80.9 78.0 - 100.0 fL   MCH 26.7 26.0 - 34.0 pg   MCHC 32.9 30.0 - 36.0 g/dL   RDW 40.9 81.1 - 91.4 %   Platelets 303 150 - 400 K/uL   Neutrophils Relative % 65 43 - 77 %   Neutro Abs 8.9 (H) 1.7 - 7.7 K/uL   Lymphocytes Relative 26 12 - 46 %   Lymphs Abs 3.5 0.7 - 4.0 K/uL   Monocytes Relative 8 3 - 12 %   Monocytes Absolute 1.0 0.1 - 1.0 K/uL   Eosinophils Relative 1 0 - 5 %   Eosinophils Absolute 0.2 0.0 - 0.7 K/uL   Basophils Relative 0 0 - 1 %   Basophils Absolute 0.0 0.0 - 0.1 K/uL  Lactate dehydrogenase     Status: None    Collection Time: 07/29/14  1:10 AM  Result Value Ref Range   LDH 136 98 - 192 U/L  Comprehensive metabolic panel     Status: Abnormal   Collection Time: 07/29/14  1:10 AM  Result Value Ref Range   Sodium 132 (L) 135 - 145 mmol/L   Potassium 4.7 3.5 - 5.1 mmol/L   Chloride 103 101 - 111 mmol/L   CO2 24 22 - 32 mmol/L   Glucose, Bld 89 65 - 99 mg/dL   BUN 14 6 - 20 mg/dL  Creatinine, Ser 0.61 0.44 - 1.00 mg/dL   Calcium 9.2 8.9 - 16.1 mg/dL   Total Protein 6.4 (L) 6.5 - 8.1 g/dL   Albumin 2.6 (L) 3.5 - 5.0 g/dL   AST 17 15 - 41 U/L   ALT 14 14 - 54 U/L   Alkaline Phosphatase 111 38 - 126 U/L   Total Bilirubin 0.2 (L) 0.3 - 1.2 mg/dL   GFR calc non Af Amer >60 >60 mL/min   GFR calc Af Amer >60 >60 mL/min   Anion gap 5 5 - 15  Type and screen     Status: None   Collection Time: 07/29/14  3:44 AM  Result Value Ref Range   ABO/RH(D) O POS    Antibody Screen NEG    Sample Expiration 08/01/2014     Assessment/Plan: IUP at 38.4 wks Preeclampsia w/o severe features PROM GBS neg Cat 1 FHRT  Plan: Admit to Birthing Suite per consult with Dr. Estanislado Pandy Routine [facility] orders Desires [facility]. Pt aware of preeclampsia diagnosis (without severe features) and that she is not a candidate for [facility] delivery (also no record of attendance at [facility] class). She has a doula who is not present at the time of this note, but will be present during pt's labor course. Pt requested that induction be postponed until her spouse arrives from [place]. Explained that it is reasonable to wait as she is contracting although not feeling them. Pt contracting too frequently to Rx po Cytotec at this time. Will proceed w/ expectant management for now. Reviewed R&B of induction with patient, including need for serial induction, risk of C/S, need for further intervention. Patient seems to understand these risks and is agreeable with proceeding.     Robyne Askew, MS 07/29/2014, 4:35 AM

## 2014-07-29 NOTE — Progress Notes (Signed)
Addendum US cephalic Continue with cytotec for cervical rippining

## 2014-07-30 ENCOUNTER — Inpatient Hospital Stay (HOSPITAL_COMMUNITY): Payer: BLUE CROSS/BLUE SHIELD | Admitting: Anesthesiology

## 2014-07-30 ENCOUNTER — Encounter (HOSPITAL_COMMUNITY): Payer: Self-pay

## 2014-07-30 LAB — CBC
HCT: 34.5 % — ABNORMAL LOW (ref 36.0–46.0)
HEMATOCRIT: 31.8 % — AB (ref 36.0–46.0)
Hemoglobin: 10.3 g/dL — ABNORMAL LOW (ref 12.0–15.0)
Hemoglobin: 11.2 g/dL — ABNORMAL LOW (ref 12.0–15.0)
MCH: 25.8 pg — ABNORMAL LOW (ref 26.0–34.0)
MCH: 26.2 pg (ref 26.0–34.0)
MCHC: 32.4 g/dL (ref 30.0–36.0)
MCHC: 32.5 g/dL (ref 30.0–36.0)
MCV: 79.5 fL (ref 78.0–100.0)
MCV: 80.8 fL (ref 78.0–100.0)
Platelets: 279 10*3/uL (ref 150–400)
Platelets: 300 10*3/uL (ref 150–400)
RBC: 4 MIL/uL (ref 3.87–5.11)
RBC: 4.27 MIL/uL (ref 3.87–5.11)
RDW: 13.9 % (ref 11.5–15.5)
RDW: 14.3 % (ref 11.5–15.5)
WBC: 17.5 10*3/uL — ABNORMAL HIGH (ref 4.0–10.5)
WBC: 20.1 10*3/uL — ABNORMAL HIGH (ref 4.0–10.5)

## 2014-07-30 MED ORDER — SENNOSIDES-DOCUSATE SODIUM 8.6-50 MG PO TABS
2.0000 | ORAL_TABLET | ORAL | Status: DC
  Administered 2014-07-31 – 2014-08-01 (×2): 2 via ORAL
  Filled 2014-07-30 (×2): qty 2

## 2014-07-30 MED ORDER — BENZOCAINE-MENTHOL 20-0.5 % EX AERO
1.0000 "application " | INHALATION_SPRAY | CUTANEOUS | Status: DC | PRN
  Filled 2014-07-30: qty 56

## 2014-07-30 MED ORDER — PRENATAL MULTIVITAMIN CH
1.0000 | ORAL_TABLET | Freq: Every day | ORAL | Status: DC
  Administered 2014-07-30 – 2014-08-01 (×3): 1 via ORAL
  Filled 2014-07-30 (×3): qty 1

## 2014-07-30 MED ORDER — SODIUM BICARBONATE 8.4 % IV SOLN
INTRAVENOUS | Status: DC | PRN
  Administered 2014-07-30: 2 mL via EPIDURAL

## 2014-07-30 MED ORDER — ONDANSETRON HCL 4 MG PO TABS: 4.0000 mg | ORAL_TABLET | ORAL | Status: DC | PRN

## 2014-07-30 MED ORDER — ONDANSETRON HCL 4 MG/2ML IJ SOLN
4.0000 mg | INTRAMUSCULAR | Status: DC | PRN
Start: 2014-07-30 — End: 2014-08-01

## 2014-07-30 MED ORDER — OXYCODONE-ACETAMINOPHEN 5-325 MG PO TABS: 2.0000 | ORAL_TABLET | ORAL | Status: DC | PRN

## 2014-07-30 MED ORDER — EPHEDRINE 5 MG/ML INJ
10.0000 mg | INTRAVENOUS | Status: DC | PRN
  Filled 2014-07-30: qty 2

## 2014-07-30 MED ORDER — ACETAMINOPHEN 325 MG PO TABS: 650.0000 mg | ORAL_TABLET | ORAL | Status: DC | PRN

## 2014-07-30 MED ORDER — WITCH HAZEL-GLYCERIN EX PADS: 1.0000 "application " | MEDICATED_PAD | CUTANEOUS | Status: DC | PRN

## 2014-07-30 MED ORDER — DIBUCAINE 1 % RE OINT: 1.0000 "application " | TOPICAL_OINTMENT | RECTAL | Status: DC | PRN

## 2014-07-30 MED ORDER — DIPHENHYDRAMINE HCL 25 MG PO CAPS: 25.0000 mg | ORAL_CAPSULE | Freq: Four times a day (QID) | ORAL | Status: DC | PRN

## 2014-07-30 MED ORDER — PHENYLEPHRINE 40 MCG/ML (10ML) SYRINGE FOR IV PUSH (FOR BLOOD PRESSURE SUPPORT)
80.0000 ug | PREFILLED_SYRINGE | INTRAVENOUS | Status: DC | PRN
  Administered 2014-07-30: 80 ug via INTRAVENOUS
  Filled 2014-07-30: qty 2
  Filled 2014-07-30: qty 20

## 2014-07-30 MED ORDER — SIMETHICONE 80 MG PO CHEW: 80.0000 mg | CHEWABLE_TABLET | ORAL | Status: DC | PRN

## 2014-07-30 MED ORDER — LIDOCAINE HCL (PF) 1 % IJ SOLN
INTRAMUSCULAR | Status: DC | PRN
  Administered 2014-07-30: 6 mL
  Administered 2014-07-30: 4 mL

## 2014-07-30 MED ORDER — FENTANYL 2.5 MCG/ML BUPIVACAINE 1/10 % EPIDURAL INFUSION (WH - ANES)
14.0000 mL/h | INTRAMUSCULAR | Status: DC | PRN
  Administered 2014-07-30: 14 mL/h via EPIDURAL
  Filled 2014-07-30: qty 125

## 2014-07-30 MED ORDER — OXYCODONE-ACETAMINOPHEN 5-325 MG PO TABS: 1.0000 | ORAL_TABLET | ORAL | Status: DC | PRN

## 2014-07-30 MED ORDER — TETANUS-DIPHTH-ACELL PERTUSSIS 5-2.5-18.5 LF-MCG/0.5 IM SUSP: 0.5000 mL | Freq: Once | INTRAMUSCULAR | Status: DC

## 2014-07-30 MED ORDER — IBUPROFEN 600 MG PO TABS
600.0000 mg | ORAL_TABLET | Freq: Four times a day (QID) | ORAL | Status: DC
  Administered 2014-07-30 – 2014-08-01 (×8): 600 mg via ORAL
  Filled 2014-07-30 (×9): qty 1

## 2014-07-30 MED ORDER — ZOLPIDEM TARTRATE 5 MG PO TABS: 5.0000 mg | ORAL_TABLET | Freq: Every evening | ORAL | Status: DC | PRN

## 2014-07-30 MED ORDER — LANOLIN HYDROUS EX OINT: TOPICAL_OINTMENT | CUTANEOUS | Status: DC | PRN

## 2014-07-30 MED ORDER — DIPHENHYDRAMINE HCL 50 MG/ML IJ SOLN: 12.5000 mg | INTRAMUSCULAR | Status: DC | PRN

## 2014-07-30 MED ORDER — BUPIVACAINE HCL (PF) 0.25 % IJ SOLN
INTRAMUSCULAR | Status: DC | PRN
  Administered 2014-07-30: 4 mL

## 2014-07-30 NOTE — Anesthesia Postprocedure Evaluation (Signed)
  Anesthesia Post-op Note  Patient: Wendy FischerSydney L Bowers  Procedure(s) Performed: * No procedures listed *  Patient Location: Women's Unit  Anesthesia Type:Epidural  Level of Consciousness: awake  Airway and Oxygen Therapy: Patient Spontanous Breathing  Post-op Pain: mild  Post-op Assessment: Post-op Vital signs reviewed, Patient's Cardiovascular Status Stable, Respiratory Function Stable, No signs of Nausea or vomiting, Pain level controlled, No headache, No residual numbness and No residual motor weakness  Post-op Vital Signs: Reviewed  Last Vitals:  Filed Vitals:   07/30/14 1505  BP: 133/71  Pulse: 101  Temp: 36.6 C  Resp: 18    Complications: No apparent anesthesia complications

## 2014-07-30 NOTE — Progress Notes (Signed)
Pt w/ Stage 1 PPH. IV Pitocin given. No other uterotonics required. Will obtain CBC this am per protocol.  Sherre ScarletKimberly Haralambos Yeatts, CNM 07/30/14, 08:00 AM

## 2014-07-30 NOTE — Anesthesia Procedure Notes (Signed)

## 2014-07-30 NOTE — Lactation Note (Signed)
This note was copied from the chart of Wendy Jorge MandrilSydney Beightol. Lactation Consultation Note: Lactation brochure given with basic teaching done from baby and me book. This is mother's first child . She states she took breastfeeding classes. Father of infant is at the bedside for all teaching. Infant is 6 hours old and has not had her first feeding. Mother has attempt to breastfeed 2 times and states that infant is very sleepy. She states that at delivery the infant was cuing but no latch occurred. Infant was placed STS . Reviewed hand expression and observed a few drops of colostrum. Attempt to rouse infant with waking techniques. Infant not showing any feeding cues. I offered to assist mother with hand expression and spoon feed infant. Mother declines spoon feeding at this time. Advised mother to continue to rouse infant with STS and watch for feeding cues. Mother receptive to all teaching.   Patient Name: Wendy Bowers Today's Date: 07/30/2014 Reason for consult: Initial assessment   Maternal Data Has patient been taught Hand Expression?: Yes Does the patient have breastfeeding experience prior to this delivery?: No  Feeding Feeding Type: Breast Fed  LATCH Score/Interventions                      Lactation Tools Discussed/Used     Consult Status      Michel BickersKendrick, Debborah Alonge McCoy 07/30/2014, 3:22 PM

## 2014-07-30 NOTE — Anesthesia Preprocedure Evaluation (Signed)
Anesthesia Evaluation  Patient identified by MRN, date of birth, ID band Patient awake    Reviewed: Allergy & Precautions, H&P , Patient's Chart, lab work & pertinent test results  Airway Mallampati: II  TM Distance: >3 FB Neck ROM: full    Dental  (+) Teeth Intact   Pulmonary    breath sounds clear to auscultation       Cardiovascular hypertension,  Rhythm:regular Rate:Normal     Neuro/Psych    GI/Hepatic   Endo/Other  Morbid obesity  Renal/GU      Musculoskeletal   Abdominal   Peds  Hematology   Anesthesia Other Findings       Reproductive/Obstetrics (+) Pregnancy                             Anesthesia Physical Anesthesia Plan  ASA: III  Anesthesia Plan: Epidural   Post-op Pain Management:    Induction:   Airway Management Planned:   Additional Equipment:   Intra-op Plan:   Post-operative Plan:   Informed Consent: I have reviewed the patients History and Physical, chart, labs and discussed the procedure including the risks, benefits and alternatives for the proposed anesthesia with the patient or authorized representative who has indicated his/her understanding and acceptance.   Dental Advisory Given  Plan Discussed with:   Anesthesia Plan Comments: (Labs checked- platelets confirmed with RN in room. Fetal heart tracing, per RN, reported to be stable enough for sitting procedure. Discussed epidural, and patient consents to the procedure:  included risk of possible headache,backache, failed block, allergic reaction, and nerve injury. This patient was asked if she had any questions or concerns before the procedure started.)        Anesthesia Quick Evaluation  

## 2014-07-30 NOTE — Progress Notes (Addendum)
Patient has been difficult to monitor due to patient frequently changing positions. RN will adjust monitors & patient moves within a few minutes of obtaining FHR. Patient has also been off monitor to restroom several times & refused to plug monitors back in. RN explained the importance of fetal monitoring to patient, and plugged monitors back in.

## 2014-07-31 LAB — CBC
HCT: 31.1 % — ABNORMAL LOW (ref 36.0–46.0)
Hemoglobin: 10.1 g/dL — ABNORMAL LOW (ref 12.0–15.0)
MCH: 26.2 pg (ref 26.0–34.0)
MCHC: 32.5 g/dL (ref 30.0–36.0)
MCV: 80.6 fL (ref 78.0–100.0)
Platelets: 294 10*3/uL (ref 150–400)
RBC: 3.86 MIL/uL — AB (ref 3.87–5.11)
RDW: 14.2 % (ref 11.5–15.5)
WBC: 16 10*3/uL — ABNORMAL HIGH (ref 4.0–10.5)

## 2014-07-31 NOTE — Progress Notes (Addendum)
Subjective: Postpartum Day 1: Vaginal delivery, 2nd degree perineal laceration Patient up ad lib, reports no syncope or dizziness. Feeding: Breast Contraceptive plan:  Likely Micronor  Objective: Vital signs in last 24 hours: Temp:  [97.7 F (36.5 C)-98.3 F (36.8 C)] 97.7 F (36.5 C) (06/05 0542) Pulse Rate:  [88-101] 90 (06/05 0542) Resp:  [18-20] 18 (06/05 0542) BP: (127-134)/(71-86) 133/86 mmHg (06/05 0542) SpO2:  [97 %-98 %] 98 % (06/05 0542)   Filed Vitals:   07/30/14 1100 07/30/14 1505 07/30/14 2155 07/31/14 0542  BP: 127/75 133/71 130/75 133/86  Pulse: 89 101 88 90  Temp: 98.3 F (36.8 C) 97.9 F (36.6 C) 97.8 F (36.6 C) 97.7 F (36.5 C)  TempSrc: Oral Oral Oral Oral  Resp: 18 18 20 18   Height:      Weight:      SpO2: 98% 97% 98% 98%    Physical Exam:  General: alert Lochia: appropriate Uterine Fundus: firm Perineum: healing well DVT Evaluation: No evidence of DVT seen on physical exam. Negative Homan's sign.  CBC Latest Ref Rng 07/31/2014 07/30/2014 07/30/2014  WBC 4.0 - 10.5 K/uL 16.0(H) 20.1(H) 17.5(H)  Hemoglobin 12.0 - 15.0 g/dL 10.1(L) 10.3(L) 11.2(L)  Hematocrit 36.0 - 46.0 % 31.1(L) 31.8(L) 34.5(L)  Platelets 150 - 400 K/uL 294 279 300      Assessment/Plan: Status post vaginal delivery day 1. Resolving leukocytosis Stable Continue current care. Plan for discharge tomorrow    Nyra CapesLATHAM, VICKICNM 07/31/2014, 9:15 AM

## 2014-07-31 NOTE — Lactation Note (Signed)
This note was copied from the chart of Wendy Jorge MandrilSydney Pickler. Lactation Consultation Note  Patient Name: Wendy Bowers Today's Date: 07/31/2014 Reason for consult: Follow-up assessment (per mom baby just fed 10 mins, and re-latched for 10 mins )  Baby 31 hours old and has been to the breast consistently  , Average time breast feeding 10 -30 mins , and one x 7 mins. Mom denies discomfort with latching and no sore nipples thus far.  LC assessed breast tissue when LC was showing mom how to use hand pump , reviewed hand expressing and noted the areola  To have some edema at the base. Semi compressible areola , after breast massage , hand express, reverse pressure improved. Also adding the pre-pumping with the hand pump to make it more elastic at the base improved the compressibility of the areola for a deeper latch. Also to prevent mom form getting sore . LC instructed mom on the hand pump , and shells between feedings when mom isn't sleeping or 15 mins before  The feeding to elongate the nipple . Per mom doesn't feel as swollen as yesterday.   mom brought her DEBP , LC showed mom and dad how to set up and also hand pump , checked flange and  for today #24 Flange the good fit and probably will need #27 when  milk comes in due to areola edema.  According to doc flow sheets , voids and stools adequate for age. Bili check at 18 hours old 3.5.      Maternal Data Has patient been taught Hand Expression?: Yes  Feeding Feeding Type: Breast Fed Length of feed: 10 min (per mom )  LATCH Score/Interventions                Intervention(s): Breastfeeding basics reviewed (see LC note )     Lactation Tools Discussed/Used Tools: Shells;Pump;Flanges Flange Size: 27 Shell Type: Inverted Breast pump type: Manual Pump Review: Setup, frequency, and cleaning Initiated by:: MAI  Date initiated:: 07/31/14   Consult Status Consult Status: Follow-up Date: 08/01/14 Follow-up type:  In-patient    Kathrin Greathouseorio, [name] 07/31/2014, 4:08 PM

## 2014-07-31 NOTE — Discharge Instructions (Signed)

## 2014-07-31 NOTE — Discharge Summary (Addendum)
  Vaginal Delivery Discharge Summary  Wendy FischerSydney L Bowers  DOB:    December 29, 1985 MRN:    [id number] CSN:    [id number]  Date of admission:                  07/29/14  Date of discharge:                   08/01/14  Procedures this admission:   SVB, repair of 2nd degree perineal laceration  Date of Delivery: 07/30/14  Newborn Data:  Live born female  Birth Weight: 6 lb 12 oz (3062 g) APGAR: 9, 9  Home with mother. Name: Wendy GowerCharlotte   History of Present Illness:  Wendy Bowers is a 29 y.o. female, G1P1001, who presents at 163w5d weeks gestation. [facility]. She was admitted for rupture of membranes. Her pregnancy has been complicated by:  Patient Active Problem List   Diagnosis Date Noted  . Vaginal delivery 07/30/2014  . Hypertension in pregnancy, preeclampsia 07/29/2014  . Vitamin D deficiency 07/29/2014  . Migraine headache 07/29/2014  . Infertility, female, primary - IVF pregnancy 07/29/2014  . Family history of cleft palate with cleft lip 07/29/2014  . Obesity 07/29/2014     Hospital Course:   Admitting Dx: IUP at 38 5/7 weeks, SROM, pre-eclampsia without severe features GBS Status:  Negative Delivering Clinician: Sherre ScarletKimberly Williams, CNM Lacerations/MLE: 2nd perineal laceration Complications: Stage 1 PPH  Intrapartum Procedures: spontaneous vaginal delivery Postpartum Procedures: none Complications-Operative and Postpartum: 2nd degree perineal laceration and Stage 1 PPH  Discharge Diagnoses: Term Pregnancy-delivered and pre-eclampsia without severe features  Feeding:  breast  Contraception:  Micronor  Hemoglobin Results:  CBC Latest Ref Rng 07/31/2014 07/30/2014 07/30/2014  WBC 4.0 - 10.5 K/uL 16.0(H) 20.1(H) 17.5(H)  Hemoglobin 12.0 - 15.0 g/dL 10.1(L) 10.3(L) 11.2(L)  Hematocrit 36.0 - 46.0 % 31.1(L) 31.8(L) 34.5(L)  Platelets 150 - 400 K/uL 294 279 300     Discharge Physical Exam:   General: alert Lochia: appropriate Uterine Fundus: firm Incision: healing well DVT Evaluation: No evidence of DVT seen on physical exam. Negative Homan's sign.   Discharge Information:  Activity:           pelvic rest Diet:                routine Medications: Ibuprofen and Micronor Condition:      stable Instructions:  Routine pp instructions   Discharge to: home  Follow-up Information    [facility]. Schedule an appointment as soon as possible for a visit in 6 weeks.   Specialty:  Obstetrics and Gynecology   Why:  Call with any questions or concerns.   Contact information:   [address] [place] [id number] [phone]        Nigel BridgemanLATHAM, Elbie Statzer CNM 08/01/2014 11:08 AM

## 2014-08-01 MED ORDER — NORETHINDRONE 0.35 MG PO TABS
1.0000 | ORAL_TABLET | Freq: Every day | ORAL | Status: DC
Start: 2014-08-21 — End: 2016-12-22

## 2014-08-01 MED ORDER — IBUPROFEN 600 MG PO TABS: 600.0000 mg | ORAL_TABLET | Freq: Four times a day (QID) | ORAL | Status: DC | PRN

## 2014-08-01 NOTE — Lactation Note (Signed)
This note was copied from the chart of Wendy Bowers. Lactation Consultation Note  Patient Name: Wendy Jorge MandrilSydney Omura Today's Date: 08/01/2014 Reason for consult: Follow-up assessment Baby 53 hours old. Parents state baby ate within last hour. Mom states that her nipples are sore and she is uncomfortable throughout the feeding. Mom has comfort gels and is currently wearing them. Mom holding baby and LC cannot examine breasts, so enc mom to call at next BF [facility] before D/C. Discussed hand expression and engorgement prevention/treatment. Referred parents to Baby and Me booklet for number of diapers to expect by day of life and EBM storage guidelines. Mom aware of OP/BFSG and LC phone line assistance after D/C.   Maternal Data    Feeding Feeding Type: Breast Fed Length of feed: 10 min  LATCH Score/Interventions                      Lactation Tools Discussed/Used     Consult Status Consult Status: PRN    Geralynn OchsWILLIARD, [name] 08/01/2014, 11:31 AM

## 2014-08-01 NOTE — Lactation Note (Signed)
This note was copied from the chart of Wendy Bowers. Lactation Consultation Note  Patient Name: Wendy Bowers Today's Date: 08/01/2014 Reason for consult: Follow-up assessment Baby 54 hours old. Called to assist with latch. Positioned mom in football position. Mom's nipples have bright and darker red abrasions. Mom able to hand express colostrum, but baby not able to achieve/maintain a latch. Baby suckled LC's gloved finger, but not able to latch to breast. Demonstrated to parents that baby appears to have a tight anterior frenulum. Enc parents to discuss with baby's pediatrician, especially if nipple soreness continues. After a number of attempts at breast, fitted mom with #20 NS. Baby able to latch, maintain a latch, and transfer EBM. LC assessed first 20 minutes of BF. Discussed with parents that while using NS need to monitor milk supply and baby's weight. Discussed engorgement prevention/treatment. Referred parents to Baby and Me booklet for number of diapers to expect by day of life and EBM storage guidelines. Parents aware of OP/BFSG and LC phone line assistance after D/C.   Maternal Data    Feeding Feeding Type: Breast Fed Length of feed:  (LC assessed first 20 minutes of BF.)  LATCH Score/Interventions Latch: Grasps breast easily, tongue down, lips flanged, rhythmical sucking. Intervention(s): Adjust position;Assist with latch;Breast massage  Audible Swallowing: Spontaneous and intermittent  Type of Nipple: Flat  Comfort (Breast/Nipple): Filling, red/small blisters or bruises, mild/mod discomfort  Problem noted: Mild/Moderate discomfort  Hold (Positioning): Assistance needed to correctly position infant at breast and maintain latch. Intervention(s): Breastfeeding basics reviewed;Support Pillows;Position options;Skin to skin  LATCH Score: 7  Lactation Tools Discussed/Used Tools: Nipple Shields;Comfort gels;Shells Nipple shield size: 20;24 Shell Type:  Inverted Breast pump type: Manual   Consult Status Consult Status: PRN    Geralynn OchsWILLIARD, Amethyst Gainer 08/01/2014, 12:26 PM

## 2014-08-01 NOTE — Plan of Care (Signed)
Problem: Discharge Progression Outcomes Goal: Barriers To Progression Addressed/Resolved Outcome: Completed/Met Date Met:  08/01/14 Vaginal bleeding WNL. Goal: Activity appropriate for discharge plan Outcome: Completed/Met Date Met:  08/01/14 Tolerates walks in halls and room well. Goal: Complications resolved/controlled Outcome: Completed/Met Date Met:  08/01/14 Vaginal bleeding WNL DTR's WNL Goal: Pain controlled with appropriate interventions Outcome: Completed/Met Date Met:  08/01/14 Good pain control on po Motrin.

## 2016-02-26 NOTE — L&D Delivery Note (Signed)
Delivery Note At 5:48 PM a viable and healthy female was delivered via Vaginal, Spontaneous Delivery (Presentation: LOA  ).  APGAR: 8, 9; weight  .   Placenta status: spontaneous, intact.  Cord:  with the following complications: none.  Cord pH: na  Anesthesia:  epidural Episiotomy:  na Lacerations: 2nd degree;Perineal Suture Repair: 2.0 vicryl rapide Est. Blood Loss (mL): 100  Mom to postpartum.  Baby to Couplet care / Skin to Skin.  Tyreck Bell J 12/25/2016, 6:02 PM

## 2016-05-28 DIAGNOSIS — Z3201 Encounter for pregnancy test, result positive: Secondary | ICD-10-CM | POA: Diagnosis not present

## 2016-05-28 DIAGNOSIS — Z6827 Body mass index (BMI) 27.0-27.9, adult: Secondary | ICD-10-CM | POA: Diagnosis not present

## 2016-06-04 DIAGNOSIS — Z3481 Encounter for supervision of other normal pregnancy, first trimester: Secondary | ICD-10-CM | POA: Diagnosis not present

## 2016-06-04 DIAGNOSIS — Z3689 Encounter for other specified antenatal screening: Secondary | ICD-10-CM | POA: Diagnosis not present

## 2016-06-10 DIAGNOSIS — Z3481 Encounter for supervision of other normal pregnancy, first trimester: Secondary | ICD-10-CM | POA: Diagnosis not present

## 2016-06-10 DIAGNOSIS — Z3491 Encounter for supervision of normal pregnancy, unspecified, first trimester: Secondary | ICD-10-CM | POA: Diagnosis not present

## 2016-06-10 DIAGNOSIS — Z3689 Encounter for other specified antenatal screening: Secondary | ICD-10-CM | POA: Diagnosis not present

## 2016-06-10 DIAGNOSIS — Z36 Encounter for antenatal screening for chromosomal anomalies: Secondary | ICD-10-CM | POA: Diagnosis not present

## 2016-06-26 DIAGNOSIS — Z3682 Encounter for antenatal screening for nuchal translucency: Secondary | ICD-10-CM | POA: Diagnosis not present

## 2016-06-26 DIAGNOSIS — Z3481 Encounter for supervision of other normal pregnancy, first trimester: Secondary | ICD-10-CM | POA: Diagnosis not present

## 2016-07-16 DIAGNOSIS — J060 Acute laryngopharyngitis: Secondary | ICD-10-CM | POA: Diagnosis not present

## 2016-07-16 DIAGNOSIS — Z6837 Body mass index (BMI) 37.0-37.9, adult: Secondary | ICD-10-CM | POA: Diagnosis not present

## 2016-07-16 DIAGNOSIS — Z331 Pregnant state, incidental: Secondary | ICD-10-CM | POA: Diagnosis not present

## 2016-07-18 DIAGNOSIS — Z3482 Encounter for supervision of other normal pregnancy, second trimester: Secondary | ICD-10-CM | POA: Diagnosis not present

## 2016-07-18 DIAGNOSIS — Z361 Encounter for antenatal screening for raised alphafetoprotein level: Secondary | ICD-10-CM | POA: Diagnosis not present

## 2016-10-14 LAB — OB RESULTS CONSOLE GBS: STREP GROUP B AG: NEGATIVE

## 2016-10-14 LAB — OB RESULTS CONSOLE RUBELLA ANTIBODY, IGM: RUBELLA: IMMUNE

## 2016-10-14 LAB — OB RESULTS CONSOLE RPR: RPR: NONREACTIVE

## 2016-10-14 LAB — OB RESULTS CONSOLE GC/CHLAMYDIA
Chlamydia: NEGATIVE
Gonorrhea: NEGATIVE

## 2016-10-14 LAB — OB RESULTS CONSOLE HEPATITIS B SURFACE ANTIGEN: HEP B S AG: NEGATIVE

## 2016-10-14 LAB — OB RESULTS CONSOLE HIV ANTIBODY (ROUTINE TESTING): HIV: NONREACTIVE

## 2016-12-22 ENCOUNTER — Inpatient Hospital Stay (HOSPITAL_COMMUNITY)
Admission: AD | Admit: 2016-12-22 | Discharge: 2016-12-22 | Disposition: A | Discharge status: Home / Self Care | Payer: Medicaid Other | Source: Ambulatory Visit | Attending: Obstetrics and Gynecology | Admitting: Obstetrics and Gynecology

## 2016-12-22 ENCOUNTER — Encounter (HOSPITAL_COMMUNITY): Payer: Self-pay | Admitting: *Deleted

## 2016-12-22 DIAGNOSIS — R030 Elevated blood-pressure reading, without diagnosis of hypertension: Secondary | ICD-10-CM | POA: Insufficient documentation

## 2016-12-22 DIAGNOSIS — O26853 Spotting complicating pregnancy, third trimester: Secondary | ICD-10-CM

## 2016-12-22 DIAGNOSIS — O163 Unspecified maternal hypertension, third trimester: Secondary | ICD-10-CM

## 2016-12-22 DIAGNOSIS — Z79899 Other long term (current) drug therapy: Secondary | ICD-10-CM | POA: Insufficient documentation

## 2016-12-22 DIAGNOSIS — O26893 Other specified pregnancy related conditions, third trimester: Secondary | ICD-10-CM | POA: Insufficient documentation

## 2016-12-22 DIAGNOSIS — Z3A38 38 weeks gestation of pregnancy: Secondary | ICD-10-CM | POA: Insufficient documentation

## 2016-12-22 LAB — COMPREHENSIVE METABOLIC PANEL
ALT: 16 U/L (ref 14–54)
ANION GAP: 13 (ref 5–15)
AST: 19 U/L (ref 15–41)
Albumin: 2.7 g/dL — ABNORMAL LOW (ref 3.5–5.0)
Alkaline Phosphatase: 160 U/L — ABNORMAL HIGH (ref 38–126)
BILIRUBIN TOTAL: 0.3 mg/dL (ref 0.3–1.2)
BUN: 12 mg/dL (ref 6–20)
CALCIUM: 9.5 mg/dL (ref 8.9–10.3)
CO2: 25 mmol/L (ref 22–32)
Chloride: 97 mmol/L — ABNORMAL LOW (ref 101–111)
Creatinine, Ser: 0.64 mg/dL (ref 0.44–1.00)
GFR calc Af Amer: >60 mL/min (ref >60)
Glucose, Bld: 105 mg/dL — ABNORMAL HIGH (ref 65–99)
POTASSIUM: 5 mmol/L (ref 3.5–5.1)
Sodium: 135 mmol/L (ref 135–145)
Total Protein: 6.6 g/dL (ref 6.5–8.1)

## 2016-12-22 LAB — CBC
HEMATOCRIT: 35.3 % — AB (ref 36.0–46.0)
Hemoglobin: 11.4 g/dL — ABNORMAL LOW (ref 12.0–15.0)
MCH: 26 pg (ref 26.0–34.0)
MCHC: 32.3 g/dL (ref 30.0–36.0)
MCV: 80.4 fL (ref 78.0–100.0)
Platelets: 343 10*3/uL (ref 150–400)
RBC: 4.39 MIL/uL (ref 3.87–5.11)
RDW: 14.5 % (ref 11.5–15.5)
WBC: 12.3 10*3/uL — AB (ref 4.0–10.5)

## 2016-12-22 LAB — URINALYSIS, ROUTINE W REFLEX MICROSCOPIC
Bilirubin Urine: NEGATIVE
GLUCOSE, UA: NEGATIVE mg/dL
Ketones, ur: NEGATIVE mg/dL
Leukocytes, UA: NEGATIVE
Nitrite: NEGATIVE
PROTEIN: NEGATIVE mg/dL
Specific Gravity, Urine: 1.019 (ref 1.005–1.030)
pH: 5 (ref 5.0–8.0)

## 2016-12-22 LAB — PROTEIN / CREATININE RATIO, URINE
CREATININE, URINE: 110 mg/dL
Protein Creatinine Ratio: 0.11 mg/mg{Cre} (ref 0.00–0.15)
Total Protein, Urine: 12 mg/dL

## 2016-12-22 LAB — POCT FERN TEST: POCT FERN TEST: NEGATIVE

## 2016-12-22 NOTE — MAU Provider Note (Signed)
History     CSN: [id number]  Arrival date and time: 12/22/16 2059   First Provider Initiated Contact with Patient 12/22/16 2140      No chief complaint on file.  HPI  Ms. Wendy Bowers is a 31 y.o. G2P1001 at [redacted]w[redacted]d gestation presenting to MAU with complaints of pink spotting with wiping.  She just got off the road from travelling to [place] when she noticed the pink spotting. She called Dr. Billy Coast and he told her to come in for evaluation. She has received PNC with [facility], but recently been seen by a MD in [place]. She was last seen 10/23 where her cervix was dilated 1.5 cm. She does report having some UC's today. She also complains of leaking a clear fluid off and on all day today. She had a H/A yesterday that she took 1 Tylenol for.  The Tylenol did not relieve her H/A, but she was able to sleep and the H/A went away. She reports good (+) FM.  Past Medical History:  Diagnosis Date  . Infertility, female   . Migraine     Past Surgical History:  Procedure Laterality Date  . IVF    . WISDOM TOOTH EXTRACTION Bilateral 1999    History reviewed. No pertinent family history.  Social History  Substance Use Topics  . Smoking status: Never Smoker  . Smokeless tobacco: Never Used  . Alcohol use No    Allergies: No Known Allergies  Prescriptions Prior to Admission  Medication Sig Dispense Refill Last Dose  . acetaminophen (TYLENOL) 160 MG/5ML liquid Take 320 mg by mouth every 6 (six) hours as needed for pain (or headache).   12/21/2016 at Unknown time  . ranitidine (ZANTAC) 150 MG capsule Take 150 mg by mouth 2 (two) times daily.   12/21/2016 at Unknown time  . calcium carbonate (TUMS - DOSED IN MG ELEMENTAL CALCIUM) 500 MG chewable tablet Chew 1 tablet by mouth 2 (two) times daily as needed for indigestion or heartburn.   More than a month at Unknown time  . ibuprofen (ADVIL,MOTRIN) 600 MG tablet Take 1 tablet (600 mg total) by mouth every 6 (six) hours as needed. 30 tablet 1 More than a  month at Unknown time  . norethindrone (ORTHO MICRONOR) 0.35 MG tablet Take 1 tablet (0.35 mg total) by mouth daily. 1 Package 11 More than a month at Unknown time    Review of Systems  Constitutional: Negative.   HENT: Negative.   Eyes: Negative.   Respiratory: Negative.   Cardiovascular: Negative.   Gastrointestinal: Negative.   Endocrine: Negative.   Genitourinary: Positive for vaginal bleeding.  Musculoskeletal: Negative.   Skin: Negative.   Allergic/Immunologic: Negative.   Neurological: Negative.   Hematological: Negative.   Psychiatric/Behavioral: Negative.    Physical Exam   Blood pressure (!) 142/86, pulse 91, temperature 98 F (36.7 C), resp. rate 16, height 5\' 6"  (1.676 m), weight 245 lb (111.1 kg). Patient Vitals for the past 24 hrs:  BP Temp Pulse Resp Height Weight  12/22/16 2259 131/83 - 91 - - -  12/22/16 2223 132/88 - - - - -  12/22/16 2200 137/85 - - - - -  12/22/16 2136 (!) 142/86 - 91 - - -  12/22/16 2131 (!) 145/93 - 93 16 - -  12/22/16 2124 (!) 145/87 98 F (36.7 C) 94 16 5\' 6"  (1.676 m) 245 lb (111.1 kg)    Physical Exam  Nursing note and vitals reviewed. Constitutional: She appears well-developed and  well-nourished.  HENT:  Head: Normocephalic.  Eyes: Pupils are equal, round, and reactive to light.  Neck: Normal range of motion.  Cardiovascular: Normal rate, regular rhythm and normal heart sounds.   Respiratory: Effort normal and breath sounds normal.  GI: Soft. Bowel sounds are normal.  Genitourinary:  Genitourinary Comments: Uterus: gravid, S=D, cx: smooth, pink, no lesions, scant amt of pink tinged vaginal d/c -- fern slide obtained   Dilation: 1.5 Effacement (%): 50 Station: -3 Presentation: Vertex Exam by:: Carloyn Jaeger CNM     MAU Course  Procedures  MDM CCUA CBC CMP P/C Ratio Serial BP's Fern Test NST - FHR: 145 bpm / moderate variability / accels present / decels absent / TOCO: regular every 4-5 mins   Results for orders  placed or performed during the hospital encounter of 12/22/16 (from the past 24 hour(s))  CBC     Status: Abnormal   Collection Time: 12/22/16  9:52 PM  Result Value Ref Range   WBC 12.3 (H) 4.0 - 10.5 K/uL   RBC 4.39 3.87 - 5.11 MIL/uL   Hemoglobin 11.4 (L) 12.0 - 15.0 g/dL   HCT 16.1 (L) 09.6 - 04.5 %   MCV 80.4 78.0 - 100.0 fL   MCH 26.0 26.0 - 34.0 pg   MCHC 32.3 30.0 - 36.0 g/dL   RDW 40.9 81.1 - 91.4 %   Platelets 343 150 - 400 K/uL  Comprehensive metabolic panel     Status: Abnormal   Collection Time: 12/22/16  9:52 PM  Result Value Ref Range   Sodium 135 135 - 145 mmol/L   Potassium 5.0 3.5 - 5.1 mmol/L   Chloride 97 (L) 101 - 111 mmol/L   CO2 25 22 - 32 mmol/L   Glucose, Bld 105 (H) 65 - 99 mg/dL   BUN 12 6 - 20 mg/dL   Creatinine, Ser 7.82 0.44 - 1.00 mg/dL   Calcium 9.5 8.9 - 95.6 mg/dL   Total Protein 6.6 6.5 - 8.1 g/dL   Albumin 2.7 (L) 3.5 - 5.0 g/dL   AST 19 15 - 41 U/L   ALT 16 14 - 54 U/L   Alkaline Phosphatase 160 (H) 38 - 126 U/L   Total Bilirubin 0.3 0.3 - 1.2 mg/dL   GFR calc non Af Amer >60 >60 mL/min   GFR calc Af Amer >60 >60 mL/min   Anion gap 13 5 - 15  Protein / creatinine ratio, urine     Status: None   Collection Time: 12/22/16 10:00 PM  Result Value Ref Range   Creatinine, Urine 110.00 mg/dL   Total Protein, Urine 12 mg/dL   Protein Creatinine Ratio 0.11 0.00 - 0.15 mg/mg[Cre]  Urinalysis, Routine w reflex microscopic     Status: Abnormal   Collection Time: 12/22/16 10:00 PM  Result Value Ref Range   Color, Urine YELLOW YELLOW   APPearance CLEAR CLEAR   Specific Gravity, Urine 1.019 1.005 - 1.030   pH 5.0 5.0 - 8.0   Glucose, UA NEGATIVE NEGATIVE mg/dL   Hgb urine dipstick MODERATE (A) NEGATIVE   Bilirubin Urine NEGATIVE NEGATIVE   Ketones, ur NEGATIVE NEGATIVE mg/dL   Protein, ur NEGATIVE NEGATIVE mg/dL   Nitrite NEGATIVE NEGATIVE   Leukocytes, UA NEGATIVE NEGATIVE   RBC / HPF 0-5 0 - 5 RBC/hpf   WBC, UA 0-5 0 - 5 WBC/hpf    Bacteria, UA RARE (A) NONE SEEN   Squamous Epithelial / LPF 0-5 (A) NONE SEEN   Mucus PRESENT  Fern Test     Status: None   Collection Time: 12/22/16 10:19 PM  Result Value Ref Range   POCT Fern Test Negative = intact amniotic membranes     *Consult with Dr. Billy Coastaavon @ 2310 - notified of patient's complaints, assessments, lab results, tx plan d/c home with PEC precautions, keep appt @ [facility] on 10/30 - ok to d/c home, agrees with plan  Assessment and Plan  Spotting during pregnancy in third trimester - Information provided on vaginal bleeding during pregnancy, third trimester   Elevated blood pressure affecting pregnancy in third trimester, antepartum - Information provided on HTN during pregnancy and PEC - Keep scheduled appt with Dr. Billy Coastaavon on 10/30    Discharge home Patient verbalized an understanding of the plan of care and agrees.   Raelyn Moraolitta Yoland Scherr, MSN, CNM 12/22/2016, 9:54 PM

## 2016-12-22 NOTE — MAU Note (Signed)
Pt drove from [place] today and c/o sore low back during the trip although she spent it lying down; and reports light pink bleeding after wiping X3 today as well as clear, watery, leaking since 1200 today.

## 2016-12-25 ENCOUNTER — Encounter (HOSPITAL_COMMUNITY): Payer: Self-pay

## 2016-12-25 ENCOUNTER — Inpatient Hospital Stay (HOSPITAL_COMMUNITY)
Admission: AD | Admit: 2016-12-25 | Discharge: 2016-12-27 | DRG: 807 | Disposition: A | Discharge status: Home / Self Care | Payer: Medicaid Other | Source: Ambulatory Visit | Attending: Obstetrics and Gynecology | Admitting: Obstetrics and Gynecology

## 2016-12-25 ENCOUNTER — Inpatient Hospital Stay (HOSPITAL_COMMUNITY): Payer: Medicaid Other | Admitting: Anesthesiology

## 2016-12-25 DIAGNOSIS — O139 Gestational [pregnancy-induced] hypertension without significant proteinuria, unspecified trimester: Secondary | ICD-10-CM | POA: Diagnosis present

## 2016-12-25 DIAGNOSIS — O134 Gestational [pregnancy-induced] hypertension without significant proteinuria, complicating childbirth: Principal | ICD-10-CM | POA: Diagnosis present

## 2016-12-25 DIAGNOSIS — D649 Anemia, unspecified: Secondary | ICD-10-CM | POA: Diagnosis present

## 2016-12-25 DIAGNOSIS — O26853 Spotting complicating pregnancy, third trimester: Secondary | ICD-10-CM

## 2016-12-25 DIAGNOSIS — O9962 Diseases of the digestive system complicating childbirth: Secondary | ICD-10-CM | POA: Diagnosis present

## 2016-12-25 DIAGNOSIS — Z3A38 38 weeks gestation of pregnancy: Secondary | ICD-10-CM | POA: Diagnosis not present

## 2016-12-25 DIAGNOSIS — O99214 Obesity complicating childbirth: Secondary | ICD-10-CM | POA: Diagnosis present

## 2016-12-25 DIAGNOSIS — O9902 Anemia complicating childbirth: Secondary | ICD-10-CM | POA: Diagnosis present

## 2016-12-25 DIAGNOSIS — K219 Gastro-esophageal reflux disease without esophagitis: Secondary | ICD-10-CM | POA: Diagnosis present

## 2016-12-25 DIAGNOSIS — O163 Unspecified maternal hypertension, third trimester: Secondary | ICD-10-CM

## 2016-12-25 LAB — CBC
HEMATOCRIT: 35.9 % — AB (ref 36.0–46.0)
HEMATOCRIT: 34.2 % — AB (ref 36.0–46.0)
Hemoglobin: 11.6 g/dL — ABNORMAL LOW (ref 12.0–15.0)
Hemoglobin: 11.1 g/dL — ABNORMAL LOW (ref 12.0–15.0)
MCH: 25.9 pg — AB (ref 26.0–34.0)
MCH: 26.1 pg (ref 26.0–34.0)
MCHC: 32.3 g/dL (ref 30.0–36.0)
MCHC: 32.5 g/dL (ref 30.0–36.0)
MCV: 80.1 fL (ref 78.0–100.0)
MCV: 80.3 fL (ref 78.0–100.0)
PLATELETS: 333 10*3/uL (ref 150–400)
Platelets: 323 10*3/uL (ref 150–400)
RBC: 4.48 MIL/uL (ref 3.87–5.11)
RBC: 4.26 MIL/uL (ref 3.87–5.11)
RDW: 14.5 % (ref 11.5–15.5)
RDW: 14.6 % (ref 11.5–15.5)
WBC: 12.2 10*3/uL — AB (ref 4.0–10.5)
WBC: 18.6 10*3/uL — AB (ref 4.0–10.5)

## 2016-12-25 LAB — COMPREHENSIVE METABOLIC PANEL
ALBUMIN: 2.7 g/dL — AB (ref 3.5–5.0)
ALT: 16 U/L (ref 14–54)
AST: 17 U/L (ref 15–41)
Alkaline Phosphatase: 163 U/L — ABNORMAL HIGH (ref 38–126)
Anion gap: 8 (ref 5–15)
BUN: 10 mg/dL (ref 6–20)
CHLORIDE: 104 mmol/L (ref 101–111)
CO2: 22 mmol/L (ref 22–32)
CREATININE: 0.52 mg/dL (ref 0.44–1.00)
Calcium: 9.1 mg/dL (ref 8.9–10.3)
GFR calc Af Amer: >60 mL/min (ref >60)
GLUCOSE: 83 mg/dL (ref 65–99)
Potassium: 4.3 mmol/L (ref 3.5–5.1)
Sodium: 134 mmol/L — ABNORMAL LOW (ref 135–145)
Total Bilirubin: 0.4 mg/dL (ref 0.3–1.2)
Total Protein: 6.6 g/dL (ref 6.5–8.1)

## 2016-12-25 LAB — TYPE AND SCREEN
ABO/RH(D): O POS
Antibody Screen: NEGATIVE

## 2016-12-25 MED ORDER — LIDOCAINE HCL (PF) 1 % IJ SOLN
30.0000 mL | INTRAMUSCULAR | Status: DC | PRN
  Filled 2016-12-25: qty 30

## 2016-12-25 MED ORDER — IBUPROFEN 600 MG PO TABS
600.0000 mg | ORAL_TABLET | Freq: Four times a day (QID) | ORAL | Status: DC
  Administered 2016-12-25 – 2016-12-27 (×7): 600 mg via ORAL
  Filled 2016-12-25 (×6): qty 1

## 2016-12-25 MED ORDER — PHENYLEPHRINE 40 MCG/ML (10ML) SYRINGE FOR IV PUSH (FOR BLOOD PRESSURE SUPPORT)
80.0000 ug | PREFILLED_SYRINGE | INTRAVENOUS | Status: DC | PRN
  Filled 2016-12-25: qty 5

## 2016-12-25 MED ORDER — LACTATED RINGERS IV SOLN: 500.0000 mL | INTRAVENOUS | Status: DC | PRN

## 2016-12-25 MED ORDER — EPHEDRINE 5 MG/ML INJ
10.0000 mg | INTRAVENOUS | Status: DC | PRN
  Filled 2016-12-25: qty 2

## 2016-12-25 MED ORDER — OXYCODONE-ACETAMINOPHEN 5-325 MG PO TABS: 1.0000 | ORAL_TABLET | ORAL | Status: DC | PRN

## 2016-12-25 MED ORDER — ONDANSETRON HCL 4 MG/2ML IJ SOLN: 4.0000 mg | INTRAMUSCULAR | Status: DC | PRN

## 2016-12-25 MED ORDER — OXYCODONE-ACETAMINOPHEN 5-325 MG PO TABS: 2.0000 | ORAL_TABLET | ORAL | Status: DC | PRN

## 2016-12-25 MED ORDER — LIDOCAINE HCL (PF) 1 % IJ SOLN
INTRAMUSCULAR | Status: DC | PRN
  Administered 2016-12-25 (×2): 6 mL via EPIDURAL

## 2016-12-25 MED ORDER — BENZOCAINE-MENTHOL 20-0.5 % EX AERO
1.0000 "application " | INHALATION_SPRAY | CUTANEOUS | Status: DC | PRN
  Administered 2016-12-25: 1 via TOPICAL
  Filled 2016-12-25: qty 56

## 2016-12-25 MED ORDER — LACTATED RINGERS IV SOLN
INTRAVENOUS | Status: DC
  Administered 2016-12-25 (×2): via INTRAVENOUS

## 2016-12-25 MED ORDER — OXYTOCIN BOLUS FROM INFUSION
500.0000 mL | Freq: Once | INTRAVENOUS | Status: AC
  Administered 2016-12-25: 500 mL via INTRAVENOUS

## 2016-12-25 MED ORDER — LACTATED RINGERS IV SOLN
500.0000 mL | Freq: Once | INTRAVENOUS | Status: AC
  Administered 2016-12-25: 500 mL via INTRAVENOUS

## 2016-12-25 MED ORDER — TERBUTALINE SULFATE 1 MG/ML IJ SOLN
0.2500 mg | Freq: Once | INTRAMUSCULAR | Status: DC | PRN
  Filled 2016-12-25: qty 1

## 2016-12-25 MED ORDER — OXYTOCIN 40 UNITS IN LACTATED RINGERS INFUSION - SIMPLE MED
1.0000 m[IU]/min | INTRAVENOUS | Status: DC
  Administered 2016-12-25: 4 m[IU]/min via INTRAVENOUS
  Administered 2016-12-25: 2 m[IU]/min via INTRAVENOUS
  Filled 2016-12-25: qty 1000

## 2016-12-25 MED ORDER — LACTATED RINGERS IV SOLN: 500.0000 mL | Freq: Once | INTRAVENOUS | Status: DC

## 2016-12-25 MED ORDER — FENTANYL 2.5 MCG/ML BUPIVACAINE 1/10 % EPIDURAL INFUSION (WH - ANES)
14.0000 mL/h | INTRAMUSCULAR | Status: DC | PRN
  Administered 2016-12-25 (×2): 14 mL/h via EPIDURAL
  Filled 2016-12-25: qty 100

## 2016-12-25 MED ORDER — DIPHENHYDRAMINE HCL 50 MG/ML IJ SOLN: 12.5000 mg | INTRAMUSCULAR | Status: DC | PRN

## 2016-12-25 MED ORDER — ACETAMINOPHEN 325 MG PO TABS: 650.0000 mg | ORAL_TABLET | ORAL | Status: DC | PRN

## 2016-12-25 MED ORDER — ONDANSETRON HCL 4 MG/2ML IJ SOLN: 4.0000 mg | Freq: Four times a day (QID) | INTRAMUSCULAR | Status: DC | PRN

## 2016-12-25 MED ORDER — METHYLERGONOVINE MALEATE 0.2 MG/ML IJ SOLN: 0.2000 mg | INTRAMUSCULAR | Status: DC | PRN

## 2016-12-25 MED ORDER — ZOLPIDEM TARTRATE 5 MG PO TABS: 5.0000 mg | ORAL_TABLET | Freq: Every evening | ORAL | Status: DC | PRN

## 2016-12-25 MED ORDER — SIMETHICONE 80 MG PO CHEW: 80.0000 mg | CHEWABLE_TABLET | ORAL | Status: DC | PRN

## 2016-12-25 MED ORDER — FLEET ENEMA 7-19 GM/118ML RE ENEM: 1.0000 | ENEMA | RECTAL | Status: DC | PRN

## 2016-12-25 MED ORDER — SENNOSIDES-DOCUSATE SODIUM 8.6-50 MG PO TABS
2.0000 | ORAL_TABLET | ORAL | Status: DC
  Administered 2016-12-25 – 2016-12-26 (×2): 2 via ORAL
  Filled 2016-12-25: qty 2

## 2016-12-25 MED ORDER — PRENATAL MULTIVITAMIN CH
1.0000 | ORAL_TABLET | Freq: Every day | ORAL | Status: DC
  Administered 2016-12-26 – 2016-12-27 (×2): 1 via ORAL
  Filled 2016-12-25 (×2): qty 1

## 2016-12-25 MED ORDER — ACETAMINOPHEN 325 MG PO TABS
650.0000 mg | ORAL_TABLET | ORAL | Status: DC | PRN
  Administered 2016-12-25 – 2016-12-26 (×4): 650 mg via ORAL
  Filled 2016-12-25 (×4): qty 2

## 2016-12-25 MED ORDER — ONDANSETRON HCL 4 MG PO TABS: 4.0000 mg | ORAL_TABLET | ORAL | Status: DC | PRN

## 2016-12-25 MED ORDER — OXYTOCIN 40 UNITS IN LACTATED RINGERS INFUSION - SIMPLE MED: 2.5000 [IU]/h | INTRAVENOUS | Status: DC

## 2016-12-25 MED ORDER — WITCH HAZEL-GLYCERIN EX PADS: 1.0000 "application " | MEDICATED_PAD | CUTANEOUS | Status: DC | PRN

## 2016-12-25 MED ORDER — SOD CITRATE-CITRIC ACID 500-334 MG/5ML PO SOLN: 30.0000 mL | ORAL | Status: DC | PRN

## 2016-12-25 MED ORDER — DIBUCAINE 1 % RE OINT
1.0000 | TOPICAL_OINTMENT | RECTAL | Status: DC | PRN
Start: 2016-12-25 — End: 2016-12-27

## 2016-12-25 MED ORDER — DIPHENHYDRAMINE HCL 25 MG PO CAPS: 25.0000 mg | ORAL_CAPSULE | Freq: Four times a day (QID) | ORAL | Status: DC | PRN

## 2016-12-25 MED ORDER — METHYLERGONOVINE MALEATE 0.2 MG PO TABS: 0.2000 mg | ORAL_TABLET | ORAL | Status: DC | PRN

## 2016-12-25 MED ORDER — TETANUS-DIPHTH-ACELL PERTUSSIS 5-2.5-18.5 LF-MCG/0.5 IM SUSP: 0.5000 mL | Freq: Once | INTRAMUSCULAR | Status: DC

## 2016-12-25 MED ORDER — COCONUT OIL OIL: 1.0000 "application " | TOPICAL_OIL | Status: DC | PRN

## 2016-12-25 NOTE — Anesthesia Pain Management Evaluation Note (Signed)
  CRNA Pain Management Visit Note  Patient: Wendy FischerSydney L Sakurai, 31 y.o., female  "Hello I am a member of the anesthesia team at [facility]. We have an anesthesia team available at all times to provide care throughout the hospital, including epidural management and anesthesia for C-section. I don't know your plan for the delivery whether it a natural birth, water birth, IV sedation, nitrous supplementation, doula or epidural, but we want to meet your pain goals."   1.Was your pain managed to your expectations on prior hospitalizations?   Yes   2.What is your expectation for pain management during this hospitalization?     Epidural  3.How can we help you reach that goal?   Record the patient's initial score and the patient's pain goal.   Pain: 2  Pain Goal: 5 [facility] wants you to be able to say your pain was always managed very well.  [name] 12/25/2016

## 2016-12-25 NOTE — Anesthesia Preprocedure Evaluation (Addendum)
Anesthesia Evaluation  Patient identified by MRN, date of birth, ID band Patient awake    Reviewed: Allergy & Precautions, NPO status , Patient's Chart, lab work & pertinent test results  History of Anesthesia Complications Negative for: history of anesthetic complications  Airway Mallampati: II  TM Distance: >3 FB Neck ROM: Full    Dental  (+) Dental Advisory Given   Pulmonary neg pulmonary ROS,    breath sounds clear to auscultation       Cardiovascular hypertension (Preg induced, no meds),  Rhythm:Regular Rate:Normal     Neuro/Psych  Headaches,    GI/Hepatic Neg liver ROS, GERD  Controlled,  Endo/Other  negative endocrine ROSMorbid obesity  Renal/GU negative Renal ROS     Musculoskeletal   Abdominal (+) + obese,   Peds  Hematology plt 333k   Anesthesia Other Findings   Reproductive/Obstetrics (+) Pregnancy                            Anesthesia Physical Anesthesia Plan  ASA: II  Anesthesia Plan: Epidural   Post-op Pain Management:    Induction:   PONV Risk Score and Plan: Treatment may vary due to age or medical condition  Airway Management Planned: Natural Airway  Additional Equipment:   Intra-op Plan:   Post-operative Plan:   Informed Consent: I have reviewed the patients History and Physical, chart, labs and discussed the procedure including the risks, benefits and alternatives for the proposed anesthesia with the patient or authorized representative who has indicated his/her understanding and acceptance.   Dental advisory given  Plan Discussed with:   Anesthesia Plan Comments: (Patient identified. Risks/Benefits/Options discussed with patient including but not limited to bleeding, infection, nerve damage, paralysis, failed block, incomplete pain control, headache, blood pressure changes, nausea, vomiting, reactions to medication both or allergic, itching and postpartum  back pain. Confirmed with bedside nurse the patient's most recent platelet count. Confirmed with patient that they are not currently taking any anticoagulation, have any bleeding history or any family history of bleeding disorders. Patient expressed understanding and wished to proceed. All questions were answered. )        Anesthesia Quick Evaluation

## 2016-12-25 NOTE — H&P (Signed)
Wendy FischerSydney L Bowers is a 31 y.o. female presenting for IOL due to Gestational HTN. OB History    Gravida Para Term Preterm AB Living   2 1 1     1    SAB TAB Ectopic Multiple Live Births         0 1     Past Medical History:  Diagnosis Date  . Infertility, female   . Migraine    Past Surgical History:  Procedure Laterality Date  . IVF    . WISDOM TOOTH EXTRACTION Bilateral 1999   Family History: family history is not on file. Social History:  reports that she has never smoked. She has never used smokeless tobacco. She reports that she does not drink alcohol or use drugs.     Maternal Diabetes: No Genetic Screening: Normal Maternal Ultrasounds/Referrals: Normal Fetal Ultrasounds or other Referrals:  None Maternal Substance Abuse:  No Significant Maternal Medications:  None Significant Maternal Lab Results:  None Other Comments:  None  Review of Systems  Constitutional: Negative.   All other systems reviewed and are negative.  Maternal Medical History:  Contractions: Onset was less than 1 hour ago.   Frequency: rare.    Fetal activity: Perceived fetal activity is normal.   Last perceived fetal movement was within the past hour.    Prenatal complications: PIH.   Prenatal Complications - Diabetes: none.      unknown if currently breastfeeding. Maternal Exam:  Uterine Assessment: Contraction strength is mild.  Contraction frequency is rare.   Abdomen: Patient reports no abdominal tenderness. Fetal presentation: vertex  Introitus: Normal vulva. Normal vagina.  Ferning test: not done.  Nitrazine test: not done. Amniotic fluid character: not assessed.  Pelvis: adequate for delivery.   Cervix: Cervix evaluated by digital exam.     Physical Exam  Nursing note and vitals reviewed. Constitutional: She is oriented to person, place, and time. She appears well-developed and well-nourished.  HENT:  Head: Normocephalic and atraumatic.  Neck: Normal range of motion. Neck  supple.  Cardiovascular: Normal rate and regular rhythm.   Respiratory: Effort normal and breath sounds normal.  GI: Soft. Bowel sounds are normal.  Genitourinary: Vagina normal and uterus normal.  Musculoskeletal: Normal range of motion.  Neurological: She is alert and oriented to person, place, and time. She has normal reflexes.  Skin: Skin is warm and dry.  Psychiatric: She has a normal mood and affect.    Prenatal labs: ABO, Rh:   Antibody:   Rubella:   RPR:    HBsAg:    HIV:    GBS:     Assessment/Plan: 38w 6d Gestational HTN IOL   Asa Fath J 12/25/2016, 9:46 AM

## 2016-12-25 NOTE — Progress Notes (Signed)
Monitors removed.

## 2016-12-25 NOTE — Anesthesia Procedure Notes (Signed)
Epidural Patient location during procedure: OB Start time: 12/25/2016 3:45 PM End time: 12/25/2016 4:15 PM  Staffing Anesthesiologist: Jairo BenJACKSON, Dezaray Shibuya Performed: anesthesiologist   Preanesthetic Checklist Completed: patient identified, surgical consent, pre-op evaluation, timeout performed, IV checked, risks and benefits discussed and monitors and equipment checked  Epidural Patient position: sitting Prep: site prepped and draped and DuraPrep Patient monitoring: blood pressure, continuous pulse ox and heart rate Approach: midline Location: L2-L3 Injection technique: LOR air  Needle:  Needle type: Tuohy  Needle gauge: 17 G Needle length: 9 cm Needle insertion depth: 6 cm Catheter type: closed end flexible Catheter size: 19 Gauge Catheter at skin depth: 11 cm Test dose: negative (1% lidocaine)  Assessment Events: blood not aspirated, injection not painful, no injection resistance, negative IV test and no paresthesia  Additional Notes Pt identified in Labor room.  Monitors applied. Working IV access confirmed. Sterile prep, drape lumbar spine.  1% lido local L 3,4.  #17ga Touhy LOR air at 6 cm, unable to thread cath, repeat local L 2,3 and LOR 6 cm, cath in easily to 11 cm skin. Test dose OK, cath dosed and infusion begun.  Patient asymptomatic, VSS, no heme aspirated, tolerated well.  Sandford Craze Dreshaun Stene, MDReason for block:procedure for pain

## 2016-12-26 ENCOUNTER — Inpatient Hospital Stay (HOSPITAL_COMMUNITY): Payer: Self-pay

## 2016-12-26 LAB — CBC
HCT: 29.6 % — ABNORMAL LOW (ref 36.0–46.0)
Hemoglobin: 9.9 g/dL — ABNORMAL LOW (ref 12.0–15.0)
MCH: 26.4 pg (ref 26.0–34.0)
MCHC: 33.4 g/dL (ref 30.0–36.0)
MCV: 78.9 fL (ref 78.0–100.0)
PLATELETS: 286 10*3/uL (ref 150–400)
RBC: 3.75 MIL/uL — ABNORMAL LOW (ref 3.87–5.11)
RDW: 14.5 % (ref 11.5–15.5)
WBC: 13.5 10*3/uL — AB (ref 4.0–10.5)

## 2016-12-26 LAB — RPR: RPR Ser Ql: NONREACTIVE

## 2016-12-26 NOTE — Lactation Note (Addendum)
This note was copied from a baby's chart. Lactation Consultation Note  Patient Name: Wendy Bowers WUJWJ'XToday's Date: 12/26/2016 Reason for consult: Initial assessment  Baby 18 hours old. Mom reports nursing first child, and states that it has been much easier getting started nursing this baby. Discussed progression of milk coming to volume. Mom reports that baby spit up colostrum after last BF. Enc mom to hold baby upright for 15-20 minutes after next feeding. Enc mom to continue nursing with cues. Mom given [facility] broch[facility], aware of OP/BFSG and LC phone line assistance after D/C.    Maternal Data Has patient been taught Hand Expression?: Yes Does the patient have breastfeeding experience prior to this delivery?: Yes  Feeding Feeding Type: Breast Fed Length of feed: 14 min  LATCH Score                   Interventions    Lactation Tools Discussed/Used     Consult Status Consult Status: Follow-up Date: 12/27/16 Follow-up type: In-patient    Sherlyn HayJennifer D Daleyza Gadomski 12/26/2016, 12:38 PM

## 2016-12-26 NOTE — Progress Notes (Signed)
Post Partum Day 1, IOL GHTN, G2P2002, SVD, 2nd degr, BOY 10/31, 5.48 pm.  Subjective: no complaints, up ad lib, voiding and tolerating PO Denies HA/ SOB/ vision changes/ CP CHTN with no meds.   Objective: Blood pressure 124/78, pulse 76, temperature 98 F (36.7 C), temperature source Oral, resp. rate 18, SpO2 99 %, unknown if currently breastfeeding. Temp:  [97.2 F (36.2 C)-99.2 F (37.3 C)] 98 F (36.7 C) (11/01 0134) Pulse Rate:  [76-145] 76 (11/01 0134) Resp:  [14-20] 18 (11/01 0134) BP: (101-148)/(56-93) 124/78 (11/01 0134) SpO2:  [99 %] 99 % (11/01 0134) Physical Exam:  General: alert and cooperative Lochia: appropriate Uterine Fundus: firm Incision: healing well DVT Evaluation: No evidence of DVT seen on physical exam.  DTR +2  CBC Latest Ref Rng & Units 12/26/2016 12/25/2016 12/25/2016  WBC 4.0 - 10.5 K/uL 13.5(H) 18.6(H) 12.2(H)  Hemoglobin 12.0 - 15.0 g/dL 1.6(X9.9(L) 11.1(L) 11.6(L)  Hematocrit 36.0 - 46.0 % 29.6(L) 34.2(L) 35.9(L)  Platelets 150 - 400 K/uL 286 323 333   CMP Latest Ref Rng & Units 12/25/2016 12/22/2016   Glucose 65 - 99 mg/dL 83 096(E105(H)   BUN 6 - 20 mg/dL 10 12   Creatinine 4.540.44 - 1.00 mg/dL 0.980.52 1.190.64   Sodium 147135 - 145 mmol/L 134(L) 135   Potassium 3.5 - 5.1 mmol/L 4.3 5.0   Chloride 101 - 111 mmol/L 104 97(L)   CO2 22 - 32 mmol/L 22 25   Calcium 8.9 - 10.3 mg/dL 9.1 9.5   Total Protein 6.5 - 8.1 g/dL 6.6 6.6   Total Bilirubin 0.3 - 1.2 mg/dL 0.4 0.3   Alkaline Phos 38 - 126 U/L 163(H) 160(H)   AST 15 - 41 U/L 17 19   ALT 14 - 54 U/L 16 16    O(+) Rub Imm   Assessment/Plan: Plan for discharge tomorrow, Breastfeeding and Lactation consult Circ planned before discharge by Dr Billy Coastaavon.  BP stable, no meds, warning s/s reviewed No h/o depression    LOS: 1 day   Wendy Bowers 12/26/2016, 8:31 AM

## 2016-12-26 NOTE — Lactation Note (Signed)
This note was copied from a baby's chart. Lactation Consultation Note  Patient Name: Wendy Jorge MandrilSydney Trang IONGE'XToday's Date: 12/26/2016 Reason for consult: Follow-up assessment  Baby 22 hours old. Mom asking for latch assistance. Mom has baby latched in cradle position to left breast with baby's right arm between his chest and mom's breast, and mom reports nipples are sore. Assisted mom to rotate baby, chest-to-chest, with arms open around breast. Baby latched more deeply and mom reported increased comfort. Enc mom to use EBM on nipples for comfort and healing. Enc mom to support newborn's head and her breast when latching and demonstrated positioning with pillows and blanket rolls.   Maternal Data Has patient been taught Hand Expression?: Yes Does the patient have breastfeeding experience prior to this delivery?: Yes  Feeding Feeding Type: Breast Fed Length of feed: 20 min  LATCH Score Latch: Grasps breast easily, tongue down, lips flanged, rhythmical sucking.  Audible Swallowing: A few with stimulation  Type of Nipple: Everted at rest and after stimulation  Comfort (Breast/Nipple): Filling, red/small blisters or bruises, mild/mod discomfort  Hold (Positioning): Assistance needed to correctly position infant at breast and maintain latch.  LATCH Score: 7  Interventions Interventions: Breast feeding basics reviewed;Assisted with latch;Breast compression;Adjust position;Support pillows;Position options  Lactation Tools Discussed/Used     Consult Status Consult Status: Follow-up Date: 12/27/16 Follow-up type: In-patient    Sherlyn HayJennifer D Azeem Poorman 12/26/2016, 4:36 PM

## 2016-12-26 NOTE — Anesthesia Postprocedure Evaluation (Signed)
Anesthesia Post Note  Patient: Wendy Bowers  Procedure(s) Performed: AN AD HOC LABOR EPIDURAL     Patient location during evaluation: Mother Baby Anesthesia Type: Epidural Level of consciousness: awake Pain management: pain level controlled Vital Signs Assessment: post-procedure vital signs reviewed and stable Respiratory status: spontaneous breathing Cardiovascular status: stable Postop Assessment: no headache, epidural receding and patient able to bend at knees Anesthetic complications: no    Last Vitals:  Vitals:   12/25/16 2210 12/26/16 0134  BP: 138/78 124/78  Pulse: 84 76  Resp:  18  Temp:  36.7 C  SpO2:  99%    Last Pain:  Vitals:   12/26/16 0522  TempSrc:   PainSc: 1    Pain Goal: Patients Stated Pain Goal: 5 (12/25/16 1728)               Edison PaceWILKERSON,Tagen Milby

## 2016-12-27 MED ORDER — IBUPROFEN 600 MG PO TABS
600.0000 mg | ORAL_TABLET | Freq: Four times a day (QID) | ORAL | 0 refills | Status: DC
Start: 2016-12-27 — End: 2019-04-16

## 2016-12-27 MED ORDER — DOCUSATE SODIUM 100 MG PO CAPS: 100.0000 mg | ORAL_CAPSULE | Freq: Two times a day (BID) | ORAL | 0 refills | Status: DC

## 2016-12-27 MED ORDER — FERROUS SULFATE 325 (65 FE) MG PO TABS: 325.0000 mg | ORAL_TABLET | Freq: Every day | ORAL | 3 refills | Status: DC

## 2016-12-27 NOTE — Discharge Summary (Signed)
OB Discharge Summary  Patient Name: Wendy Bowers DOB: 1985/12/29 MRN: [id number]  Date of admission: 12/25/2016 Delivering MD: Olivia Mackie   Date of discharge: 12/27/2016  Admitting diagnosis: INDUCTION Intrauterine pregnancy: [redacted]w[redacted]d     Secondary diagnosis:Principal Problem:   Postpartum care following vaginal delivery (10/31) Active Problems:   Gestational hypertension   Second-degree perineal laceration, with delivery  Additional problems:anemia     Discharge diagnosis: Term Pregnancy Delivered and Gestational Hypertension                                                                     Post partum procedures:none  Augmentation: Pitocin  Complications: None  Hospital course:  Induction of Labor With Vaginal Delivery   31 y.o. yo [id number] at [redacted]w[redacted]d was admitted to the hospital 12/25/2016 for induction of labor.  Indication for induction: Gestational hypertension.  Patient had an uncomplicated labor course as follows: Membrane Rupture Time/Date: 10:02 AM ,12/25/2016   Intrapartum Procedures: Episiotomy:                                           Lacerations:  2nd degree [3];Perineal [11]  Patient had delivery of a Viable infant.  Information for the patient's newborn:  Shara, Hartis [[id number]]  Delivery Method: Vaginal, Spontaneous Delivery (Filed from Delivery Summary)   12/25/2016  Details of delivery can be found in separate delivery note.  Patient had a routine postpartum course. Patient is discharged home 12/27/16.  On PPD#2, no symptoms of anemia, no CP, no SOB, no HA. Pt with no prior h/o htn, Not on any meds for bp.   Physical exam  Vitals:   12/26/16 0857 12/26/16 1210 12/26/16 1803 12/27/16 0601  BP: 118/72 134/84 128/79 118/76  Pulse: 84  92 79  Resp: 18  18 18   Temp: 98.4 F (36.9 C)  98.4 F (36.9 C) 98.1 F (36.7 C)  TempSrc: Oral  Oral Oral  SpO2:    100%   General: alert Lochia: appropriate Uterine Fundus: firm Incision:  N/A DVT Evaluation: No evidence of DVT seen on physical exam. Labs: Lab Results  Component Value Date   WBC 13.5 (H) 12/26/2016   HGB 9.9 (L) 12/26/2016   HCT 29.6 (L) 12/26/2016   MCV 78.9 12/26/2016   PLT 286 12/26/2016   CMP Latest Ref Rng & Units 12/25/2016  Glucose 65 - 99 mg/dL 83  BUN 6 - 20 mg/dL 10  Creatinine 1.30 - 8.65 mg/dL 7.84  Sodium 696 - 295 mmol/L 134(L)  Potassium 3.5 - 5.1 mmol/L 4.3  Chloride 101 - 111 mmol/L 104  CO2 22 - 32 mmol/L 22  Calcium 8.9 - 10.3 mg/dL 9.1  Total Protein 6.5 - 8.1 g/dL 6.6  Total Bilirubin 0.3 - 1.2 mg/dL 0.4  Alkaline Phos 38 - 126 U/L 163(H)  AST 15 - 41 U/L 17  ALT 14 - 54 U/L 16    Discharge instruction: per After Visit Summary and "Baby and Me Booklet".  After Visit Meds:  Allergies as of 12/27/2016   No Known Allergies     Medication List  TAKE these medications   docusate sodium 100 MG capsule Commonly known as:  COLACE Take 1 capsule (100 mg total) by mouth every 12 (twelve) hours.   ferrous sulfate 325 (65 FE) MG tablet Commonly known as:  FERROUSUL Take 1 tablet (325 mg total) by mouth daily with breakfast.   ibuprofen 600 MG tablet Commonly known as:  ADVIL,MOTRIN Take 1 tablet (600 mg total) by mouth every 6 (six) hours.   ranitidine 150 MG capsule Commonly known as:  ZANTAC Take 150 mg by mouth every evening.       Diet: routine diet  Activity: Advance as tolerated. Pelvic rest for 6 weeks.   Outpatient follow up:6 weeks Follow up Appt:No future appointments. Follow up visit: No Follow-up on file.  Postpartum contraception: Progesterone only pills and vs IUD, pt interested in IUD, unsure about coverage  Newborn Data: Live born female  Birth Weight: 7 lb 9.9 oz (3455 g) APGAR: 9, 9  Newborn Delivery   Birth date/time:  12/25/2016 17:48:00 Delivery type:  Vaginal, Spontaneous Delivery      Baby Feeding: Bottle Disposition:home with mother   12/27/2016 Lendon ColonelFOGLEMAN,Benuel Ly A., MD

## 2016-12-27 NOTE — Lactation Note (Signed)
This note was copied from a baby's chart. Lactation Consultation Note Experienced BF mom asked about having baby's tongue tie clipped. Mom stated her daughter had it done at 4910 days old, made a world of difference and BF issues resolved.  Mom has LARGE pendulous breast, flat tender nipples are firey red. Comfort gels given. Nipples are very compressible. Discussed chin tug and lip flange for top lip. Discussed feeding positions, props, support,STS, I&O, assessing breast before and after BF for transfer. Discussed one of the issues w/tongue tie is the transfer of milk from breast. Mom stated she had that issue w/her daughter loosing weight until her tongue clipped. Gave mom shells to wear in bra, hand pump to pre-pump nipples if needed. Discussed using NS for pain and if issues of transfer d/t tongue tie. Mom needs to see LC again before d/c home if d/c. Baby having bili serum and PKU heel stick. Baby does have some redness to top of head. Mom thankful for consult.  Patient Name: Wendy Bowers Reason for consult: Follow-up assessment   Maternal Data    Feeding Feeding Type: Breast Fed Length of feed: 15 min  LATCH Score Latch: Repeated attempts needed to sustain latch, nipple held in mouth throughout feeding, stimulation needed to elicit sucking reflex.  Audible Swallowing: Spontaneous and intermittent  Type of Nipple: Flat  Comfort (Breast/Nipple): Filling, red/small blisters or bruises, mild/mod discomfort  Hold (Positioning): Assistance needed to correctly position infant at breast and maintain latch.  LATCH Score: 6  Interventions Interventions: Breast feeding basics reviewed;Breast compression;Comfort gels;Assisted with latch;Adjust position;Hand pump;Skin to skin;Support pillows;Breast massage;Position options;Hand express;Pre-pump if needed;Shells  Lactation Tools Discussed/Used Tools: Shells;Pump;Comfort gels Shell Type: Inverted Breast pump type:  Manual Pump Review: Setup, frequency, and cleaning;Milk Storage Initiated by:: Peri JeffersonL. Raife Lizer RN IBCLC Date initiated:: 12/27/16   Consult Status Consult Status: Follow-up Date: 12/27/16 Follow-up type: In-patient    Charyl DancerCARVER, Shaneequa Bahner G Bowers, 6:17 AM

## 2017-04-02 ENCOUNTER — Ambulatory Visit (HOSPITAL_COMMUNITY)
Admission: RE | Admit: 2017-04-02 | Discharge: 2017-04-02 | Disposition: A | Discharge status: Home / Self Care | Payer: 59 | Source: Ambulatory Visit | Attending: Adult Health Nurse Practitioner | Admitting: Adult Health Nurse Practitioner

## 2017-04-02 ENCOUNTER — Other Ambulatory Visit (HOSPITAL_COMMUNITY): Payer: Self-pay | Admitting: Adult Health Nurse Practitioner

## 2017-04-02 DIAGNOSIS — R1032 Left lower quadrant pain: Secondary | ICD-10-CM

## 2017-04-02 DIAGNOSIS — K668 Other specified disorders of peritoneum: Secondary | ICD-10-CM | POA: Insufficient documentation

## 2017-04-02 DIAGNOSIS — I318 Other specified diseases of pericardium: Secondary | ICD-10-CM | POA: Insufficient documentation

## 2017-07-23 ENCOUNTER — Other Ambulatory Visit (HOSPITAL_COMMUNITY): Payer: Self-pay | Admitting: Adult Health Nurse Practitioner

## 2017-07-23 DIAGNOSIS — I318 Other specified diseases of pericardium: Secondary | ICD-10-CM

## 2017-08-01 ENCOUNTER — Ambulatory Visit (HOSPITAL_COMMUNITY)
Admission: RE | Admit: 2017-08-01 | Discharge: 2017-08-01 | Disposition: A | Discharge status: Home / Self Care | Payer: 59 | Source: Ambulatory Visit | Attending: Adult Health Nurse Practitioner | Admitting: Adult Health Nurse Practitioner

## 2017-08-01 ENCOUNTER — Encounter (HOSPITAL_COMMUNITY): Payer: Self-pay

## 2018-03-25 DIAGNOSIS — G43109 Migraine with aura, not intractable, without status migrainosus: Secondary | ICD-10-CM | POA: Diagnosis not present

## 2018-03-25 DIAGNOSIS — H35363 Drusen (degenerative) of macula, bilateral: Secondary | ICD-10-CM | POA: Diagnosis not present

## 2018-03-25 DIAGNOSIS — H43393 Other vitreous opacities, bilateral: Secondary | ICD-10-CM | POA: Diagnosis not present

## 2018-03-25 DIAGNOSIS — S0512XS Contusion of eyeball and orbital tissues, left eye, sequela: Secondary | ICD-10-CM | POA: Diagnosis not present

## 2018-04-15 DIAGNOSIS — Z6836 Body mass index (BMI) 36.0-36.9, adult: Secondary | ICD-10-CM | POA: Diagnosis not present

## 2018-04-15 DIAGNOSIS — Z1389 Encounter for screening for other disorder: Secondary | ICD-10-CM | POA: Diagnosis not present

## 2018-04-15 DIAGNOSIS — Z01419 Encounter for gynecological examination (general) (routine) without abnormal findings: Secondary | ICD-10-CM | POA: Diagnosis not present

## 2018-04-15 DIAGNOSIS — Z130 Encounter for screening for diseases of the blood and blood-forming organs and certain disorders involving the immune mechanism: Secondary | ICD-10-CM | POA: Diagnosis not present

## 2018-05-05 DIAGNOSIS — Z30430 Encounter for insertion of intrauterine contraceptive device: Secondary | ICD-10-CM | POA: Diagnosis not present

## 2018-08-17 DIAGNOSIS — Z6836 Body mass index (BMI) 36.0-36.9, adult: Secondary | ICD-10-CM | POA: Diagnosis not present

## 2018-08-17 DIAGNOSIS — Z713 Dietary counseling and surveillance: Secondary | ICD-10-CM | POA: Diagnosis not present

## 2018-08-17 DIAGNOSIS — Z136 Encounter for screening for cardiovascular disorders: Secondary | ICD-10-CM | POA: Diagnosis not present

## 2018-08-25 DIAGNOSIS — R109 Unspecified abdominal pain: Secondary | ICD-10-CM | POA: Diagnosis not present

## 2018-08-25 DIAGNOSIS — J029 Acute pharyngitis, unspecified: Secondary | ICD-10-CM | POA: Diagnosis not present

## 2018-08-25 DIAGNOSIS — Z136 Encounter for screening for cardiovascular disorders: Secondary | ICD-10-CM | POA: Diagnosis not present

## 2018-08-25 DIAGNOSIS — I318 Other specified diseases of pericardium: Secondary | ICD-10-CM | POA: Diagnosis not present

## 2018-08-25 DIAGNOSIS — R05 Cough: Secondary | ICD-10-CM | POA: Diagnosis not present

## 2018-08-25 DIAGNOSIS — Z713 Dietary counseling and surveillance: Secondary | ICD-10-CM | POA: Diagnosis not present

## 2018-08-25 DIAGNOSIS — R509 Fever, unspecified: Secondary | ICD-10-CM | POA: Diagnosis not present

## 2018-09-22 DIAGNOSIS — Z0000 Encounter for general adult medical examination without abnormal findings: Secondary | ICD-10-CM | POA: Diagnosis not present

## 2018-09-22 DIAGNOSIS — Z1329 Encounter for screening for other suspected endocrine disorder: Secondary | ICD-10-CM | POA: Diagnosis not present

## 2018-09-24 DIAGNOSIS — E669 Obesity, unspecified: Secondary | ICD-10-CM | POA: Diagnosis not present

## 2018-09-24 DIAGNOSIS — Z0001 Encounter for general adult medical examination with abnormal findings: Secondary | ICD-10-CM | POA: Diagnosis not present

## 2018-09-24 DIAGNOSIS — Z6836 Body mass index (BMI) 36.0-36.9, adult: Secondary | ICD-10-CM | POA: Diagnosis not present

## 2018-10-12 DIAGNOSIS — Z713 Dietary counseling and surveillance: Secondary | ICD-10-CM | POA: Diagnosis not present

## 2018-10-12 DIAGNOSIS — Z6836 Body mass index (BMI) 36.0-36.9, adult: Secondary | ICD-10-CM | POA: Diagnosis not present

## 2018-10-12 DIAGNOSIS — E669 Obesity, unspecified: Secondary | ICD-10-CM | POA: Diagnosis not present

## 2018-12-24 DIAGNOSIS — R109 Unspecified abdominal pain: Secondary | ICD-10-CM | POA: Diagnosis not present

## 2018-12-24 DIAGNOSIS — Z713 Dietary counseling and surveillance: Secondary | ICD-10-CM | POA: Diagnosis not present

## 2018-12-24 DIAGNOSIS — Z136 Encounter for screening for cardiovascular disorders: Secondary | ICD-10-CM | POA: Diagnosis not present

## 2018-12-24 DIAGNOSIS — I318 Other specified diseases of pericardium: Secondary | ICD-10-CM | POA: Diagnosis not present

## 2019-01-18 DIAGNOSIS — E669 Obesity, unspecified: Secondary | ICD-10-CM | POA: Diagnosis not present

## 2019-01-18 DIAGNOSIS — Z6836 Body mass index (BMI) 36.0-36.9, adult: Secondary | ICD-10-CM | POA: Diagnosis not present

## 2019-01-18 DIAGNOSIS — Z713 Dietary counseling and surveillance: Secondary | ICD-10-CM | POA: Diagnosis not present

## 2019-01-19 DIAGNOSIS — J069 Acute upper respiratory infection, unspecified: Secondary | ICD-10-CM | POA: Diagnosis not present

## 2019-01-19 DIAGNOSIS — Z20828 Contact with and (suspected) exposure to other viral communicable diseases: Secondary | ICD-10-CM | POA: Diagnosis not present

## 2019-04-16 ENCOUNTER — Other Ambulatory Visit: Payer: Self-pay

## 2019-04-16 ENCOUNTER — Encounter (HOSPITAL_BASED_OUTPATIENT_CLINIC_OR_DEPARTMENT_OTHER): Payer: Self-pay | Admitting: Obstetrics and Gynecology

## 2019-04-16 NOTE — Progress Notes (Signed)
Spoke w/ via phone for pre-op interview---[facility] needs dos----cbc, urine preg              COVID test ------04-20-2019 Arrive at -------530 am 04-23-2019 NPO after ------midnight Medications to take morning of surgery -----none Diabetic medication -----n/a Patient Special Instructions ----- Pre-Op special Istructions ----- Patient verbalized understanding of instructions that were given at this phone interview. Patient denies shortness of breath, chest pain, fever, cough a this phone interview.

## 2019-04-20 ENCOUNTER — Other Ambulatory Visit: Payer: Self-pay

## 2019-04-20 ENCOUNTER — Other Ambulatory Visit (HOSPITAL_COMMUNITY)
Admission: RE | Admit: 2019-04-20 | Discharge: 2019-04-20 | Disposition: A | Discharge status: Home / Self Care | Payer: 59 | Source: Ambulatory Visit | Attending: Obstetrics and Gynecology | Admitting: Obstetrics and Gynecology

## 2019-04-20 DIAGNOSIS — Z01812 Encounter for preprocedural laboratory examination: Secondary | ICD-10-CM | POA: Diagnosis present

## 2019-04-20 DIAGNOSIS — Z20822 Contact with and (suspected) exposure to covid-19: Secondary | ICD-10-CM | POA: Diagnosis not present

## 2019-04-20 LAB — SARS CORONAVIRUS 2 (TAT 6-24 HRS): SARS Coronavirus 2: NEGATIVE

## 2019-04-22 NOTE — Anesthesia Preprocedure Evaluation (Addendum)
Anesthesia Evaluation  Patient identified by MRN, date of birth, ID band Patient awake    Reviewed: Allergy & Precautions, NPO status , Patient's Chart, lab work & pertinent test results  History of Anesthesia Complications Negative for: history of anesthetic complications  Airway Mallampati: II  TM Distance: >3 FB Neck ROM: Full    Dental no notable dental hx. (+) Dental Advisory Given   Pulmonary neg pulmonary ROS,    Pulmonary exam normal        Cardiovascular negative cardio ROS Normal cardiovascular exam     Neuro/Psych negative neurological ROS     GI/Hepatic negative GI ROS, Neg liver ROS,   Endo/Other  negative endocrine ROS  Renal/GU negative Renal ROS     Musculoskeletal negative musculoskeletal ROS (+)   Abdominal   Peds  Hematology negative hematology ROS (+)   Anesthesia Other Findings Day of surgery medications reviewed with the patient.  Reproductive/Obstetrics                            Anesthesia Physical Anesthesia Plan  ASA: I  Anesthesia Plan: General   Post-op Pain Management:    Induction: Intravenous  PONV Risk Score and Plan: 4 or greater and Ondansetron, Dexamethasone, Midazolam and Scopolamine patch - Pre-op  Airway Management Planned: Oral ETT  Additional Equipment:   Intra-op Plan:   Post-operative Plan: Extubation in OR  Informed Consent: I have reviewed the patients History and Physical, chart, labs and discussed the procedure including the risks, benefits and alternatives for the proposed anesthesia with the patient or authorized representative who has indicated his/her understanding and acceptance.     Dental advisory given  Plan Discussed with: Anesthesiologist and CRNA  Anesthesia Plan Comments:        Anesthesia Quick Evaluation

## 2019-04-22 NOTE — H&P (Signed)
Wendy Bowers is an 34 y.o. female. Had Mirena placed last year, is bleeding for 2 days every 10 days or so, also passes mucus or small clot randomly. Sexually active, still some deep positional discomfort-a bit worse, reduced libido-might be due to pain, orgasm ok. Normal Pap 2 years ago. Has had some random pelvic pain.  Would like BTL and IUD removed.  Pelvic ultrasound is normal, IUD in proper position, up to 4 cm simple ovarian cysts, left ovary might be stuck to posterior uterus.  Pertinent Gynecological History: Last pap: normal Date: 02/2017 OB History: G2, P2002 SVD x 2   Menstrual History: No LMP recorded. (Menstrual status: IUD).    Past Medical History:  Diagnosis Date  . Infertility, female   . Migraine     Past Surgical History:  Procedure Laterality Date  . IVF    . WISDOM TOOTH EXTRACTION Bilateral 1999    History reviewed. No pertinent family history.  Social History:  reports that she has never smoked. She has never used smokeless tobacco. She reports that she does not drink alcohol or use drugs.  Allergies: No Known Allergies  No medications prior to admission.    Review of Systems  Respiratory: Negative.   Cardiovascular: Negative.     Height 5\' 6"  (1.676 m), weight 103 kg, unknown if currently breastfeeding. Physical Exam  Constitutional: She appears well-developed and well-nourished.  Cardiovascular: Normal rate, regular rhythm and normal heart sounds.  No murmur heard. Respiratory: Effort normal and breath sounds normal. No respiratory distress. She has no wheezes.  GI: Soft. She exhibits no distension and no mass. There is no abdominal tenderness.  Genitourinary:    Uterus normal.     Genitourinary Comments: Mild tenderness bilateral PFM and posterior fornix No adnexal mass     No results found for this or any previous visit (from the past 24 hour(s)).  No results found.  Assessment/Plan: Pelvic pain and dyspareunia, desires permanent  sterility and IUD removed.  We have discussed all medical and surgical options.  Discussed surgical procedure, risks, chances of relieving pain.  Will admit for diagnostic/possible operative laparoscopy with bilateral salpingectomy, IUD removal  Wendy Bowers 04/22/2019, 8:19 PM

## 2019-04-23 ENCOUNTER — Encounter (HOSPITAL_BASED_OUTPATIENT_CLINIC_OR_DEPARTMENT_OTHER): Payer: Self-pay | Admitting: Obstetrics and Gynecology

## 2019-04-23 ENCOUNTER — Ambulatory Visit (HOSPITAL_BASED_OUTPATIENT_CLINIC_OR_DEPARTMENT_OTHER): Payer: 59 | Admitting: Anesthesiology

## 2019-04-23 ENCOUNTER — Encounter (HOSPITAL_BASED_OUTPATIENT_CLINIC_OR_DEPARTMENT_OTHER)
Admission: RE | Disposition: A | Discharge status: Home / Self Care | Payer: Self-pay | Source: Home / Self Care | Attending: Obstetrics and Gynecology

## 2019-04-23 ENCOUNTER — Ambulatory Visit (HOSPITAL_BASED_OUTPATIENT_CLINIC_OR_DEPARTMENT_OTHER)
Admission: RE | Admit: 2019-04-23 | Discharge: 2019-04-23 | Disposition: A | Discharge status: Home / Self Care | Payer: 59 | Attending: Obstetrics and Gynecology | Admitting: Obstetrics and Gynecology

## 2019-04-23 DIAGNOSIS — Z30432 Encounter for removal of intrauterine contraceptive device: Secondary | ICD-10-CM | POA: Insufficient documentation

## 2019-04-23 DIAGNOSIS — Z793 Long term (current) use of hormonal contraceptives: Secondary | ICD-10-CM | POA: Insufficient documentation

## 2019-04-23 DIAGNOSIS — N83292 Other ovarian cyst, left side: Secondary | ICD-10-CM | POA: Insufficient documentation

## 2019-04-23 DIAGNOSIS — R102 Pelvic and perineal pain: Secondary | ICD-10-CM | POA: Diagnosis not present

## 2019-04-23 DIAGNOSIS — N838 Other noninflammatory disorders of ovary, fallopian tube and broad ligament: Secondary | ICD-10-CM | POA: Insufficient documentation

## 2019-04-23 DIAGNOSIS — N941 Unspecified dyspareunia: Secondary | ICD-10-CM | POA: Insufficient documentation

## 2019-04-23 HISTORY — PX: LAPAROSCOPY: SHX197

## 2019-04-23 HISTORY — PX: IUD REMOVAL: SHX5392

## 2019-04-23 HISTORY — PX: LAPAROSCOPIC BILATERAL SALPINGECTOMY: SHX5889

## 2019-04-23 LAB — CBC
HCT: 45.4 % (ref 36.0–46.0)
Hemoglobin: 14.5 g/dL (ref 12.0–15.0)
MCH: 28.2 pg (ref 26.0–34.0)
MCHC: 31.9 g/dL (ref 30.0–36.0)
MCV: 88.2 fL (ref 80.0–100.0)
Platelets: 327 10*3/uL (ref 150–400)
RBC: 5.15 MIL/uL — ABNORMAL HIGH (ref 3.87–5.11)
RDW: 12.6 % (ref 11.5–15.5)
WBC: 7.9 10*3/uL (ref 4.0–10.5)
nRBC: 0 % (ref 0.0–0.2)

## 2019-04-23 LAB — POCT PREGNANCY, URINE: Preg Test, Ur: NEGATIVE

## 2019-04-23 SURGERY — LAPAROSCOPY, DIAGNOSTIC
Anesthesia: General

## 2019-04-23 MED ORDER — LIDOCAINE 2% (20 MG/ML) 5 ML SYRINGE
INTRAMUSCULAR | Status: DC | PRN
  Administered 2019-04-23: 1.5 mg/kg/h via INTRAVENOUS
  Administered 2019-04-23: 80 mg via INTRAVENOUS

## 2019-04-23 MED ORDER — ONDANSETRON HCL 4 MG/2ML IJ SOLN
INTRAMUSCULAR | Status: DC | PRN
  Administered 2019-04-23: 4 mg via INTRAVENOUS

## 2019-04-23 MED ORDER — BUPIVACAINE HCL (PF) 0.25 % IJ SOLN
INTRAMUSCULAR | Status: DC | PRN
  Administered 2019-04-23: 10 mL

## 2019-04-23 MED ORDER — KETAMINE HCL 10 MG/ML IJ SOLN
INTRAMUSCULAR | Status: DC | PRN
  Administered 2019-04-23: 30 mg via INTRAVENOUS

## 2019-04-23 MED ORDER — DEXAMETHASONE SODIUM PHOSPHATE 10 MG/ML IJ SOLN
INTRAMUSCULAR | Status: AC
  Filled 2019-04-23: qty 1

## 2019-04-23 MED ORDER — FENTANYL CITRATE (PF) 100 MCG/2ML IJ SOLN
INTRAMUSCULAR | Status: AC
  Filled 2019-04-23: qty 2

## 2019-04-23 MED ORDER — MIDAZOLAM HCL 2 MG/2ML IJ SOLN
INTRAMUSCULAR | Status: AC
  Filled 2019-04-23: qty 2

## 2019-04-23 MED ORDER — KETAMINE HCL 10 MG/ML IJ SOLN
INTRAMUSCULAR | Status: AC
  Filled 2019-04-23: qty 1

## 2019-04-23 MED ORDER — PROPOFOL 10 MG/ML IV BOLUS
INTRAVENOUS | Status: AC
  Filled 2019-04-23: qty 40

## 2019-04-23 MED ORDER — FENTANYL CITRATE (PF) 100 MCG/2ML IJ SOLN
25.0000 ug | INTRAMUSCULAR | Status: DC | PRN
  Administered 2019-04-23: 25 ug via INTRAVENOUS
  Filled 2019-04-23: qty 1

## 2019-04-23 MED ORDER — ACETAMINOPHEN 500 MG PO TABS
1000.0000 mg | ORAL_TABLET | Freq: Once | ORAL | Status: AC
  Administered 2019-04-23: 1000 mg via ORAL
  Filled 2019-04-23: qty 2

## 2019-04-23 MED ORDER — MIDAZOLAM HCL 2 MG/2ML IJ SOLN
INTRAMUSCULAR | Status: DC | PRN
  Administered 2019-04-23: 2 mg via INTRAVENOUS

## 2019-04-23 MED ORDER — CELECOXIB 200 MG PO CAPS
ORAL_CAPSULE | ORAL | Status: AC
  Filled 2019-04-23: qty 2

## 2019-04-23 MED ORDER — SCOPOLAMINE 1 MG/3DAYS TD PT72
MEDICATED_PATCH | TRANSDERMAL | Status: AC
  Filled 2019-04-23: qty 1

## 2019-04-23 MED ORDER — ROCURONIUM BROMIDE 10 MG/ML (PF) SYRINGE
PREFILLED_SYRINGE | INTRAVENOUS | Status: AC
  Filled 2019-04-23: qty 10

## 2019-04-23 MED ORDER — FENTANYL CITRATE (PF) 100 MCG/2ML IJ SOLN
INTRAMUSCULAR | Status: DC | PRN
  Administered 2019-04-23: 100 ug via INTRAVENOUS

## 2019-04-23 MED ORDER — PROMETHAZINE HCL 25 MG/ML IJ SOLN
6.2500 mg | INTRAMUSCULAR | Status: DC | PRN
  Filled 2019-04-23: qty 1

## 2019-04-23 MED ORDER — ACETAMINOPHEN 500 MG PO TABS
ORAL_TABLET | ORAL | Status: AC
  Filled 2019-04-23: qty 2

## 2019-04-23 MED ORDER — LACTATED RINGERS IV SOLN
INTRAVENOUS | Status: DC
  Administered 2019-04-23: 1000 mL via INTRAVENOUS
  Filled 2019-04-23: qty 1000

## 2019-04-23 MED ORDER — LIDOCAINE 2% (20 MG/ML) 5 ML SYRINGE
INTRAMUSCULAR | Status: AC
  Filled 2019-04-23: qty 10

## 2019-04-23 MED ORDER — ARTIFICIAL TEARS OPHTHALMIC OINT
TOPICAL_OINTMENT | OPHTHALMIC | Status: AC
  Filled 2019-04-23: qty 3.5

## 2019-04-23 MED ORDER — ONDANSETRON HCL 4 MG/2ML IJ SOLN
INTRAMUSCULAR | Status: AC
  Filled 2019-04-23: qty 2

## 2019-04-23 MED ORDER — FENTANYL CITRATE (PF) 250 MCG/5ML IJ SOLN
INTRAMUSCULAR | Status: AC
  Filled 2019-04-23: qty 5

## 2019-04-23 MED ORDER — SUGAMMADEX SODIUM 200 MG/2ML IV SOLN
INTRAVENOUS | Status: DC | PRN
  Administered 2019-04-23: 200 mg via INTRAVENOUS

## 2019-04-23 MED ORDER — LACTATED RINGERS IV SOLN
INTRAVENOUS | Status: DC
  Filled 2019-04-23: qty 1000

## 2019-04-23 MED ORDER — LIDOCAINE 2% (20 MG/ML) 5 ML SYRINGE
INTRAMUSCULAR | Status: AC
  Filled 2019-04-23: qty 5

## 2019-04-23 MED ORDER — DEXAMETHASONE SODIUM PHOSPHATE 10 MG/ML IJ SOLN
INTRAMUSCULAR | Status: DC | PRN
  Administered 2019-04-23: 8 mg via INTRAVENOUS

## 2019-04-23 MED ORDER — KETOROLAC TROMETHAMINE 30 MG/ML IJ SOLN
INTRAMUSCULAR | Status: AC
  Filled 2019-04-23: qty 1

## 2019-04-23 MED ORDER — CELECOXIB 400 MG PO CAPS
400.0000 mg | ORAL_CAPSULE | Freq: Once | ORAL | Status: AC
  Administered 2019-04-23: 400 mg via ORAL
  Filled 2019-04-23: qty 1

## 2019-04-23 MED ORDER — HYDROCODONE-ACETAMINOPHEN 5-325 MG PO TABS: 1.0000 | ORAL_TABLET | Freq: Four times a day (QID) | ORAL | 0 refills | Status: AC | PRN

## 2019-04-23 MED ORDER — PROPOFOL 10 MG/ML IV BOLUS
INTRAVENOUS | Status: DC | PRN
  Administered 2019-04-23: 200 mg via INTRAVENOUS

## 2019-04-23 MED ORDER — ROCURONIUM BROMIDE 10 MG/ML (PF) SYRINGE
PREFILLED_SYRINGE | INTRAVENOUS | Status: DC | PRN
  Administered 2019-04-23: 60 mg via INTRAVENOUS

## 2019-04-23 MED ORDER — SCOPOLAMINE 1 MG/3DAYS TD PT72
1.0000 | MEDICATED_PATCH | TRANSDERMAL | Status: DC
  Administered 2019-04-23: 1.5 mg via TRANSDERMAL
  Filled 2019-04-23: qty 1

## 2019-04-23 SURGICAL SUPPLY — 32 items
CABLE HIGH FREQUENCY MONO STRZ (ELECTRODE) IMPLANT
CATH ROBINSON RED A/P 16FR (CATHETERS) ×3 IMPLANT
COVER WAND RF STERILE (DRAPES) ×3 IMPLANT
DERMABOND ADVANCED (GAUZE/BANDAGES/DRESSINGS) ×1
DERMABOND ADVANCED .7 DNX12 (GAUZE/BANDAGES/DRESSINGS) ×2 IMPLANT
DRSG COVADERM PLUS 2X2 (GAUZE/BANDAGES/DRESSINGS) IMPLANT
DRSG OPSITE POSTOP 3X4 (GAUZE/BANDAGES/DRESSINGS) IMPLANT
DURAPREP 26ML APPLICATOR (WOUND CARE) ×3 IMPLANT
GAUZE 4X4 16PLY RFD (DISPOSABLE) ×3 IMPLANT
GLOVE BIOGEL PI IND STRL 8 (GLOVE) ×2 IMPLANT
GLOVE BIOGEL PI INDICATOR 8 (GLOVE) ×1
GLOVE ORTHO TXT STRL SZ7.5 (GLOVE) ×3 IMPLANT
GOWN STRL REUS W/TWL XL LVL3 (GOWN DISPOSABLE) ×3 IMPLANT
NEEDLE INSUFFLATION 120MM (ENDOMECHANICALS) ×3 IMPLANT
NS IRRIG 1000ML POUR BTL (IV SOLUTION) ×3 IMPLANT
PACK LAPAROSCOPY BASIN (CUSTOM PROCEDURE TRAY) ×3 IMPLANT
PACK TRENDGUARD 450 HYBRID PRO (MISCELLANEOUS) ×2 IMPLANT
PROTECTOR NERVE ULNAR (MISCELLANEOUS) ×6 IMPLANT
SET IRRIG TUBING LAPAROSCOPIC (IRRIGATION / IRRIGATOR) ×3 IMPLANT
SET TUBE SMOKE EVAC HIGH FLOW (TUBING) ×3 IMPLANT
SHEARS HARMONIC ACE PLUS 36CM (ENDOMECHANICALS) IMPLANT
SUT VICRYL 0 UR6 27IN ABS (SUTURE) ×3 IMPLANT
SUT VICRYL 4-0 PS2 18IN ABS (SUTURE) ×3 IMPLANT
SYS BAG RETRIEVAL 10MM (BASKET)
SYSTEM BAG RETRIEVAL 10MM (BASKET) IMPLANT
TOWEL OR 17X26 10 PK STRL BLUE (TOWEL DISPOSABLE) ×3 IMPLANT
TRENDGUARD 450 HYBRID PRO PACK (MISCELLANEOUS) ×3
TROCAR BALLN 12MMX100 BLUNT (TROCAR) IMPLANT
TROCAR BLADELESS OPT 5 100 (ENDOMECHANICALS) ×3 IMPLANT
TROCAR XCEL BLUNT TIP 100MML (ENDOMECHANICALS) ×3 IMPLANT
TROCAR XCEL NON-BLD 11X100MML (ENDOMECHANICALS) ×3 IMPLANT
WARMER LAPAROSCOPE (MISCELLANEOUS) ×3 IMPLANT

## 2019-04-23 NOTE — Discharge Instructions (Signed)
Routine instructions for laparoscopy DISCHARGE INSTRUCTIONS: Laparoscopy  The following instructions have been prepared to help you care for yourself upon your return home today.  Wound care: Marland Kitchen Do not get the incision wet for the first 24 hours. The incision should be kept clean and dry. . The Band-Aids or dressings may be removed the day after surgery. . Should the incision become sore, red, and swollen after the first week, check with your doctor.  Personal hygiene: . Shower the day after your procedure. Do not take baths, swim, or use a hot tub until your health care provider approves.  Activity and limitations: . Do NOT drive or operate any equipment today. . Do NOT lift anything more than 15 pounds for 2-3 weeks after surgery. . Do NOT rest in bed all day. . Walking is encouraged. Walk each day, starting slowly with 5-minute walks 3 or 4 times a day. Slowly increase the length of your walks. . Walk up and down stairs slowly. . Do NOT do strenuous activities, such as golfing, playing tennis, bowling, running, biking, weight lifting, gardening, mowing, or vacuuming for 2-4 weeks. Ask your doctor when it is okay to start.  Diet: Eat a light meal as desired this evening. You may resume your usual diet tomorrow.  Return to work: This is dependent on the type of work you do. For the most part you can return to a desk job within a week of surgery. If you are more active at work, please discuss this with your doctor.  What to expect after your surgery: You may have a slight burning sensation when you urinate on the first day. You may have a very small amount of blood in the urine. Expect to have a small amount of vaginal discharge/light bleeding for 1-2 weeks. It is not unusual to have abdominal soreness and bruising for up to 2 weeks. You may be tired and need more rest for about 1 week. You may experience shoulder pain for 24-72 hours. Lying flat in bed may relieve it.  Call your doctor for  any of the following: . Develop a fever of 100.4 or greater . Inability to urinate 6 hours after discharge from hospital . Severe pain not relieved by pain medications . Persistent of heavy bleeding at incision site . Redness or swelling around incision site after a week . Increasing nausea or vomiting    NO ADVIL, ALEVE, MOTRIN, IBUPROFEN UNTIL 2:30 PM TDAY  Do not take any Tylenol until after 12:30 pm today.   Post Anesthesia Home Care Instructions  Activity: Get plenty of rest for the remainder of the day. A responsible individual must stay with you for 24 hours following the procedure.  For the next 24 hours, DO NOT: -Drive a car -Advertising copywriter -Drink alcoholic beverages -Take any medication unless instructed by your physician -Make any legal decisions or sign important papers.  Meals: Start with liquid foods such as gelatin or soup. Progress to regular foods as tolerated. Avoid greasy, spicy, heavy foods. If nausea and/or vomiting occur, drink only clear liquids until the nausea and/or vomiting subsides. Call your physician if vomiting continues.  Special Instructions/Symptoms: Your throat may feel dry or sore from the anesthesia or the breathing tube placed in your throat during surgery. If this causes discomfort, gargle with warm salt water. The discomfort should disappear within 24 hours.  If you had a scopolamine patch placed behind your ear for the management of post- operative nausea and/or vomiting:  1.  The medication in the patch is effective for 72 hours, after which it should be removed.  Wrap patch in a tissue and discard in the trash. Wash hands thoroughly with soap and water. 2. You may remove the patch earlier than 72 hours if you experience unpleasant side effects which may include dry mouth, dizziness or visual disturbances. 3. Avoid touching the patch. Wash your hands with soap and water after contact with the patch.

## 2019-04-23 NOTE — Anesthesia Procedure Notes (Signed)
Procedure Name: Intubation Date/Time: 04/23/2019 7:37 AM Performed by: Mechele Claude, CRNA Pre-anesthesia Checklist: Patient identified, Emergency Drugs available, Suction available and Patient being monitored Patient Re-evaluated:Patient Re-evaluated prior to induction Oxygen Delivery Method: Circle system utilized Preoxygenation: Pre-oxygenation with 100% oxygen Induction Type: IV induction Ventilation: Mask ventilation without difficulty Laryngoscope Size: Mac and 3 Grade View: Grade I Tube type: Oral Number of attempts: 1 Airway Equipment and Method: Stylet and Oral airway Placement Confirmation: ETT inserted through vocal cords under direct vision,  positive ETCO2 and breath sounds checked- equal and bilateral Secured at: 21 cm Tube secured with: Tape Dental Injury: Teeth and Oropharynx as per pre-operative assessment

## 2019-04-23 NOTE — Transfer of Care (Signed)
   Last Vitals:  Vitals Value Taken Time  BP 123/88 04/23/19 0846  Temp    Pulse 73 04/23/19 0849  Resp 15 04/23/19 0849  SpO2 100 % 04/23/19 0849  Vitals shown include unvalidated device data.  Last Pain:  Vitals:   04/23/19 0601  TempSrc: Oral  PainSc: 0-No pain      Patients Stated Pain Goal: 4 (04/23/19 0601) Immediate Anesthesia Transfer of Care Note  Patient: Wendy Bowers  Procedure(s) Performed: Procedure(s) (LRB): LAPAROSCOPY DIAGNOSTIC, (N/A) LAPAROSCOPIC BILATERAL SALPINGECTOMY (Bilateral) INTRAUTERINE DEVICE (IUD) REMOVAL (N/A)  Patient Location: PACU  Anesthesia Type: General  Level of Consciousness: drowsy  Airway & Oxygen Therapy: Patient Spontanous Breathing and Patient connected to nasal cannula oxygen  Post-op Assessment: Report given to PACU RN and Post -op Vital signs reviewed and stable  Post vital signs: Reviewed and stable  Complications: No apparent anesthesia complications

## 2019-04-23 NOTE — Anesthesia Postprocedure Evaluation (Signed)
Anesthesia Post Note  Patient: Wendy Bowers  Procedure(s) Performed: LAPAROSCOPY DIAGNOSTIC, (N/A ) LAPAROSCOPIC BILATERAL SALPINGECTOMY (Bilateral ) INTRAUTERINE DEVICE (IUD) REMOVAL (N/A )     Patient location during evaluation: PACU Anesthesia Type: General Level of consciousness: sedated Pain management: pain level controlled Vital Signs Assessment: post-procedure vital signs reviewed and stable Respiratory status: spontaneous breathing and respiratory function stable Cardiovascular status: stable Postop Assessment: no apparent nausea or vomiting Anesthetic complications: no    Last Vitals:  Vitals:   04/23/19 0930 04/23/19 1030  BP:  114/71  Pulse: 80 71  Resp: 15 16  Temp:  37 C  SpO2: 97% 95%    Last Pain:  Vitals:   04/23/19 1015  TempSrc:   PainSc: 3                  [name] DANIEL

## 2019-04-23 NOTE — Interval H&P Note (Signed)
History and Physical Interval Note:  04/23/2019 7:16 AM  Wendy Bowers  has presented today for surgery, with the diagnosis of dyspareunia.  The various methods of treatment have been discussed with the patient and family. After consideration of risks, benefits and other options for treatment, the patient has consented to  Procedure(s) with comments: LAPAROSCOPY DIAGNOSTIC (N/A) LAPAROSCOPIC BILATERAL SALPINGECTOMY (Bilateral) - possible INTRAUTERINE DEVICE (IUD) REMOVAL (N/A) as a surgical intervention.  The patient's history has been reviewed, patient examined, no change in status, stable for surgery.  I have reviewed the patient's chart and labs.  Questions were answered to the patient's satisfaction.     Leighton Roach [name]

## 2019-04-23 NOTE — Op Note (Signed)
Preoperative diagnosis: Pelvic pain, dyspareunia, desires surgical sterility and IUD removal Postoperative diagnosis: Same and left ovarian cyst Procedure: Diagnostic laparoscopy,  bilateral salpingectomy, drainage of left ovarian cyst, IUD removal Surgeon: Lavina Hamman M.D. Anesthesia: Gen. Endotracheal tube Findings: She had a normal abdomen and pelvis with normal uterus tubes and ovaries, 4 cm simple left ovarian cyst, IUD in proper position Specimens: Bilateral fallopian tubes Estimated blood loss: Minimal Complications: None  Procedure in detail  The patient was taken to the operating room and placed in the dorsosupine position. General anesthesia was induced. Her legs were placed in mobile stirrups and her both arms were tucked to her side. Abdomen perineum and vagina were then prepped and draped in the usual sterile fashion, bladder drained with a red robinson catheter, a Hulka tenaculum was applied to the cervix for uterine manipulation. Infraumbilical skin was then infiltrated with quarter percent Marcaine and a 1 cm vertical incision was made. The veress needle was inserted into the peritoneal cavity and placement confirmed by the water drop test and an opening pressure of 4 mm of mercury. CO2 was insufflated to a pressure of 14 mm of mercury and the veress needle was removed. A 10/11 disposable trocar was then introduced with direct visualization with the laparoscope. A 5 mm port was then placed on the left side also under direct visualization. Inspection revealed the above-mentioned findings.  No obvious source for her pain was identified.  The distal end of each tube was grasped and elevated. Using bipolar cautery I was able to free the distal end of each tube from the ovary and fulgurate across the mesosalpinx and a proximal portion of the fallopian tube. Scissors were then used to remove the fallopian tube. This is done bilaterally without difficulty. Both tubes were removed. The left  ovary was elevated, bipolar was used to help create a hole in the inferior portion of the ovary over the cyst.  The cyst drained clear fluid.  A portion of the cyst wall was fulgurated and the incised to help prevent recurrence.  All pedicles were hemostatic. The 5 mm port was removed under direct visualization. All gas was allowed to deflate from the abdomen and the umbilical trocar was removed. One figure-of-eight suture of 0 Vicryl was placed in the umbilical incision. Skin incisions were then closed with interrupted subcuticular sutures of 4-0 Vicryl followed by Dermabond. The Hulka tenaculum was removed, the IUD strings were grasped and the IUD easily removed intact. The patient was taken down from stirrups. She was awakened in the operating room and taken to the recovery room in stable condition after tolerating the procedure well. Counts were correct and she had PAS hose on throughout the procedure.

## 2019-04-26 LAB — SURGICAL PATHOLOGY

## 2019-07-05 IMAGING — CT CT RENAL STONE PROTOCOL
2 of 4 series · 17 of 46 positions shown, 19 images · non-contrast
Comparison: None.

CLINICAL DATA: Left lower quadrant pain

EXAM:
CT ABDOMEN AND PELVIS WITHOUT CONTRAST
TECHNIQUE: Multidetector CT imaging of the abdomen and pelvis was performed
following the standard protocol without IV contrast.

[Series 2: axial st · axial · 0.73mm/px · z∈[+1018,+1468]mm · 14 of 100 slices shown, 16 images]
[im 5/100  soft-tissue]
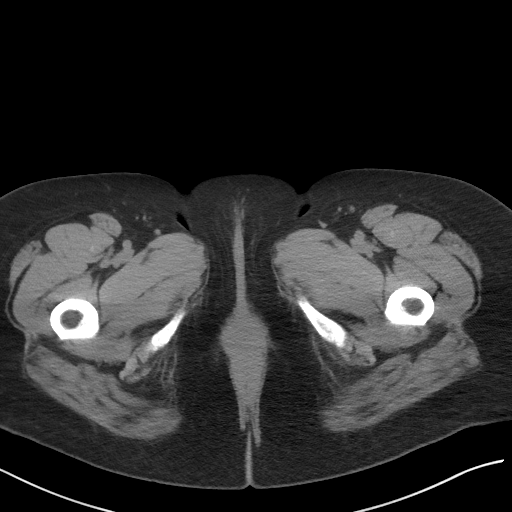
[im 5/100  bone]
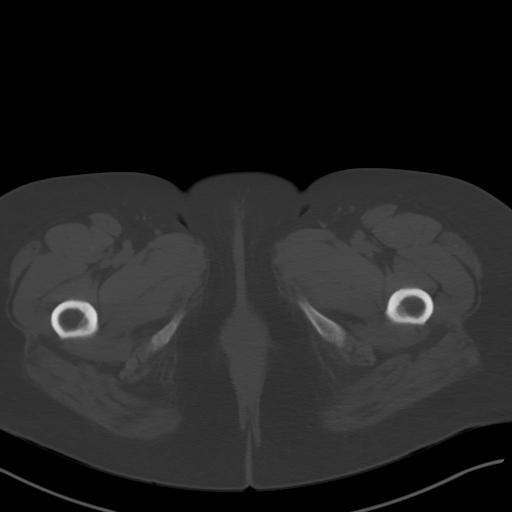
[im 13/100  soft-tissue]
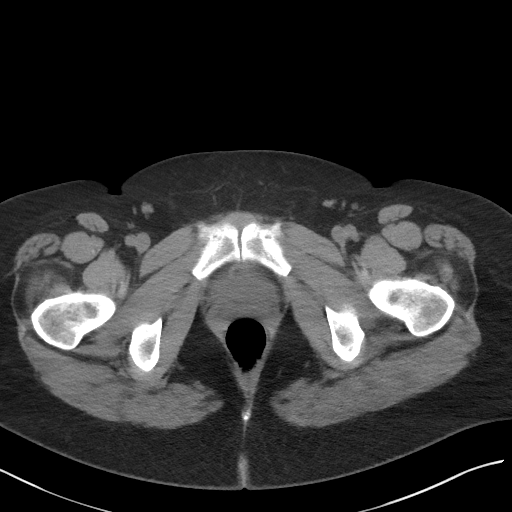
[im 21/100  soft-tissue]
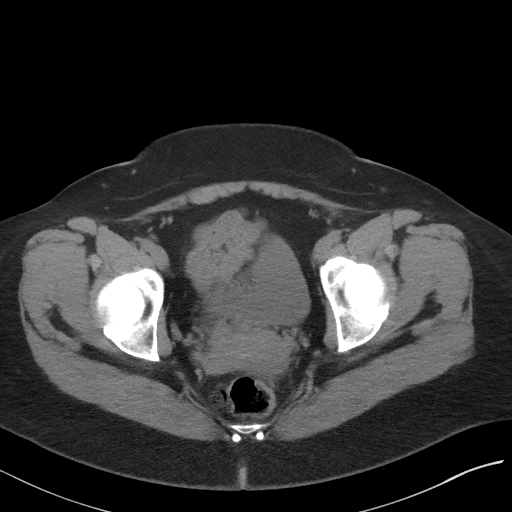
[im 25/100  soft-tissue]
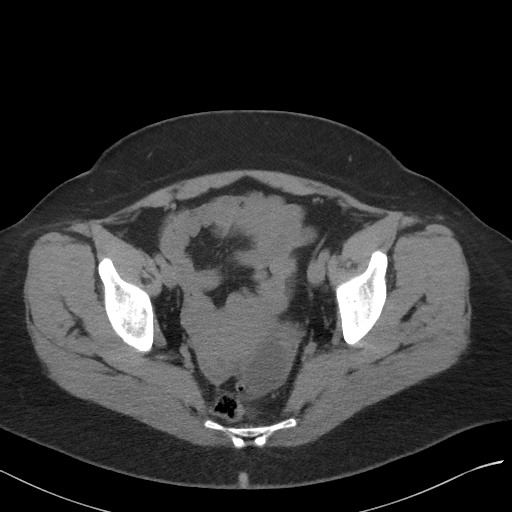
[im 34/100  soft-tissue]
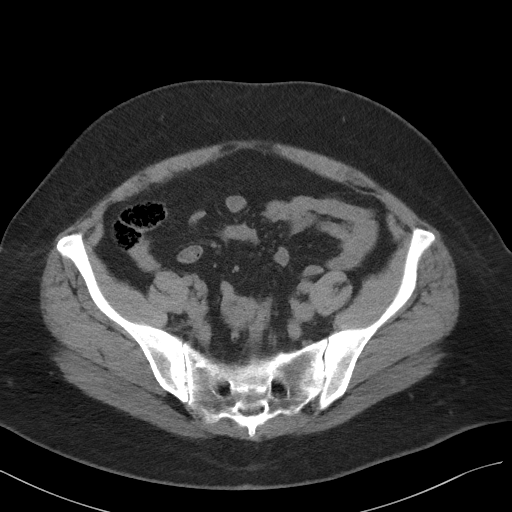
[im 42/100  soft-tissue]
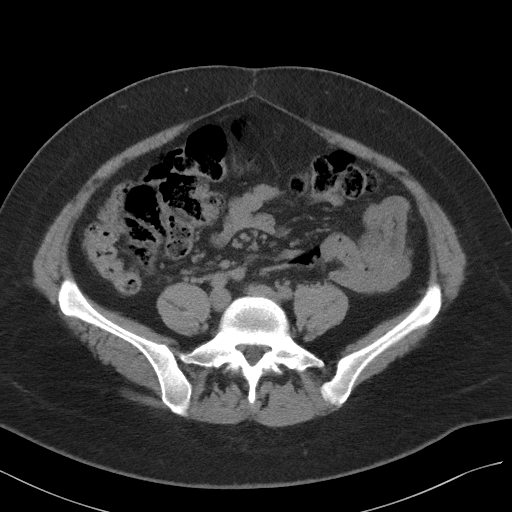
[im 46/100  soft-tissue]
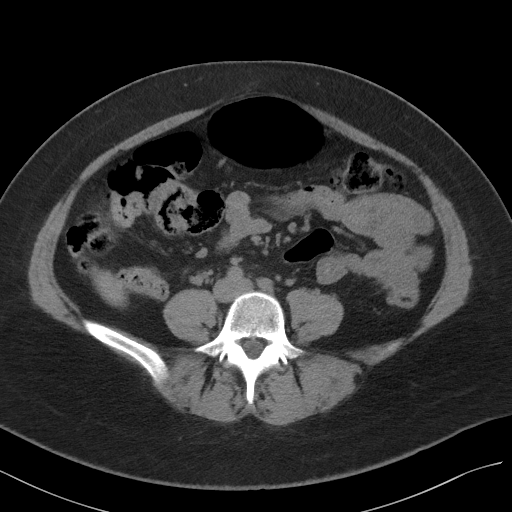
[im 54/100  soft-tissue]
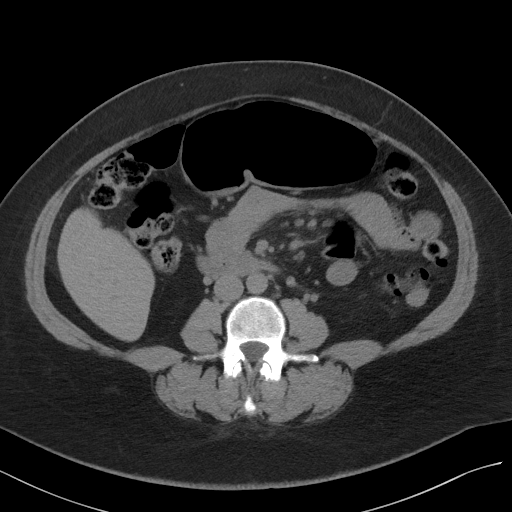
[im 58/100  soft-tissue]
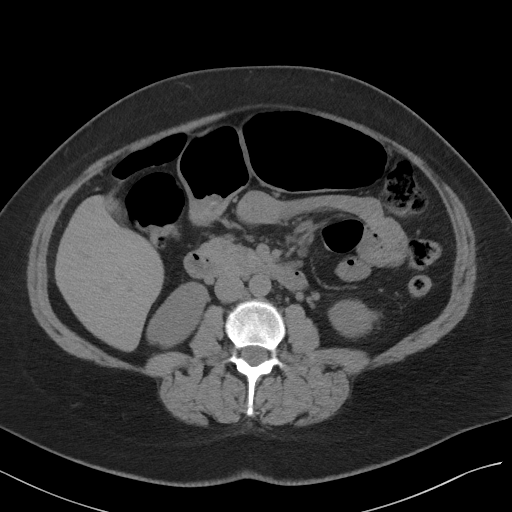
[im 58/100  bone]
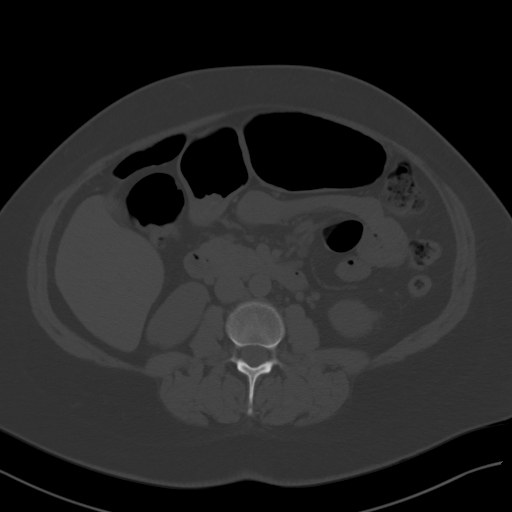
[im 67/100  soft-tissue]
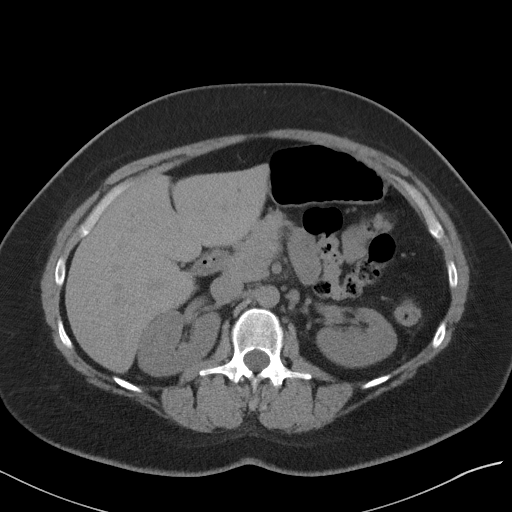
[im 75/100  soft-tissue]
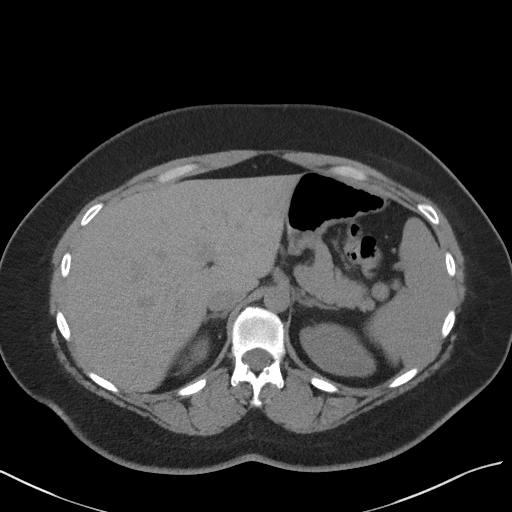
[im 79/100  soft-tissue]
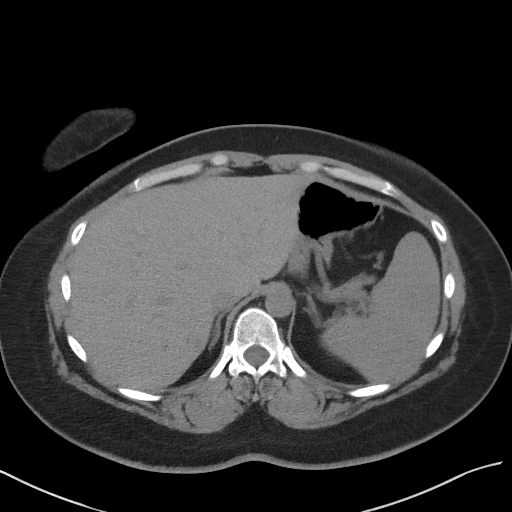
[im 87/100  soft-tissue]
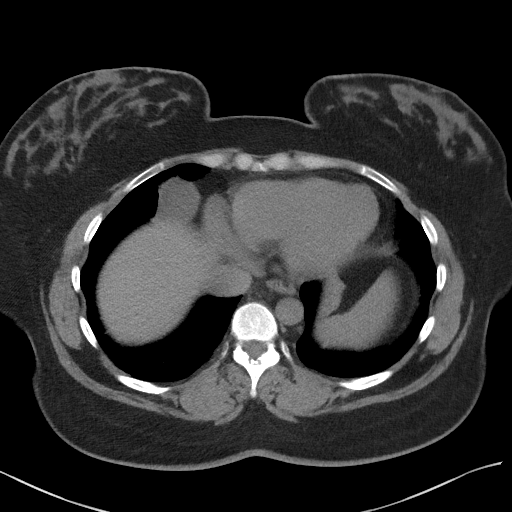
[im 95/100  soft-tissue]
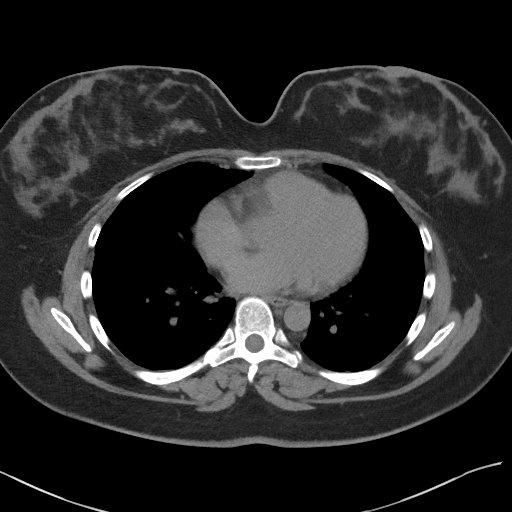

[Series 5: coronal st · coronal · 0.84mm/px · 3 of 101 slices shown]
[im 34/101  soft-tissue]
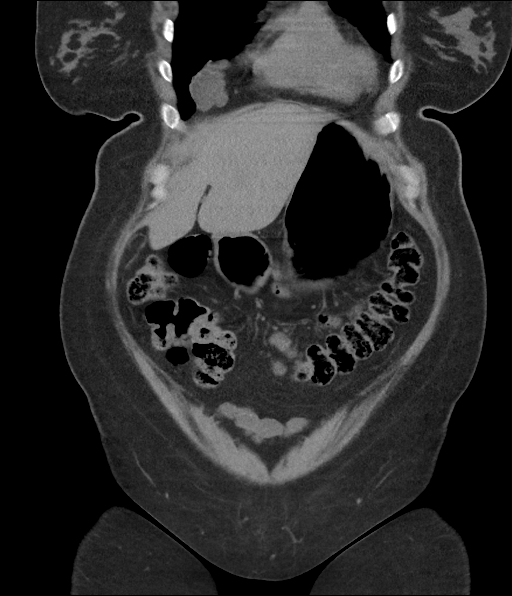
[im 45/101  soft-tissue]
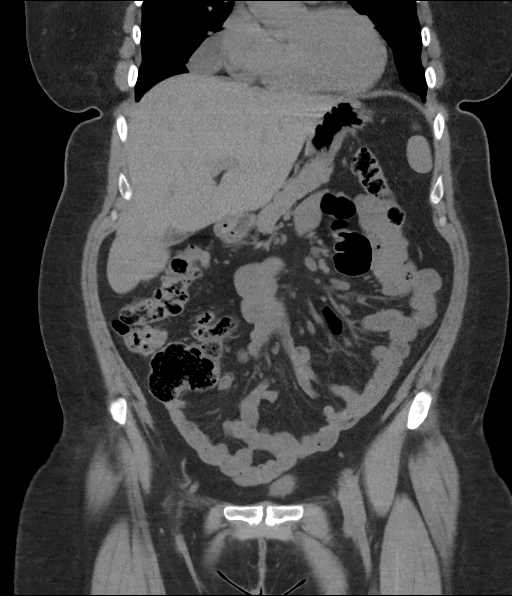
[im 56/101  soft-tissue]
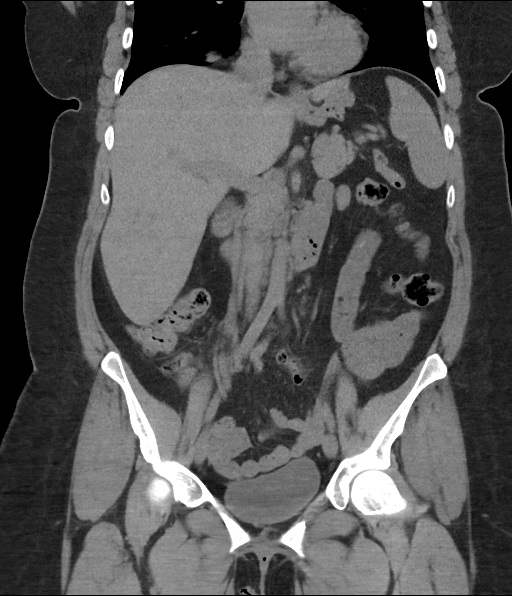

[17 of 46 positions shown; findings below may reference images not displayed]

FINDINGS: Lower chest: Lung bases clear. Right cardiophrenic angle cyst
measures 55 x 26 mm most likely a benign pericardial cyst.

Hepatobiliary: Contracted gallbladder.  Liver and bile ducts normal

Pancreas: Negative

Spleen: Negative

Adrenals/Urinary Tract: Negative for urinary tract stone or
obstruction. Ureters nondilated. Normal urinary bladder.

Stomach/Bowel: Negative for bowel obstruction. No bowel mass or
edema. Normal appendix. Negative for diverticulitis.

Vascular/Lymphatic: Negative

Reproductive: 42 x 27 x 54 mm complex cyst left adnexa. Normal
uterus and right ovary

Other: Negative for free fluid.  Negative for hernia.

Musculoskeletal: No acute findings
IMPRESSION: Negative for urinary tract calculi.  No renal obstruction

42 x 27 x 54 mm complex cyst left adnexa.  No free fluid.

Right pericardial cyst 55 x 26 mm.

## 2021-09-21 ENCOUNTER — Institutional Professional Consult (permissible substitution): Payer: Self-pay | Admitting: Plastic Surgery

## 2022-11-28 DIAGNOSIS — E611 Iron deficiency: Secondary | ICD-10-CM | POA: Diagnosis not present

## 2022-11-28 DIAGNOSIS — Z9884 Bariatric surgery status: Secondary | ICD-10-CM | POA: Diagnosis not present
